# Patient Record
Sex: Female | Born: 1990 | Race: White | Hispanic: Refuse to answer
Health system: Southern US, Community
[De-identification: ages and names within clinical notes are randomized; demographics above are authoritative.]

## PROBLEM LIST (undated history)

## (undated) HISTORY — PX: TUBAL LIGATION: SHX77

---
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
## 2009-01-17 ENCOUNTER — Emergency Department: Payer: Self-pay | Admitting: Emergency Medicine

## 2009-04-21 ENCOUNTER — Emergency Department: Payer: Self-pay | Admitting: Emergency Medicine

## 2009-07-16 ENCOUNTER — Emergency Department: Payer: Self-pay | Admitting: Emergency Medicine

## 2010-03-27 DIAGNOSIS — F17200 Nicotine dependence, unspecified, uncomplicated: Secondary | ICD-10-CM | POA: Insufficient documentation

## 2010-03-27 DIAGNOSIS — N946 Dysmenorrhea, unspecified: Secondary | ICD-10-CM | POA: Insufficient documentation

## 2010-05-08 ENCOUNTER — Emergency Department: Payer: Self-pay | Admitting: Emergency Medicine

## 2013-10-06 ENCOUNTER — Emergency Department: Payer: Self-pay | Admitting: Emergency Medicine

## 2014-03-16 ENCOUNTER — Emergency Department: Payer: Self-pay | Admitting: Emergency Medicine

## 2014-03-16 LAB — URINALYSIS, COMPLETE
Bilirubin,UR: NEGATIVE
Blood: NEGATIVE
Glucose,UR: NEGATIVE mg/dL (ref 0–75)
Ketone: NEGATIVE
Nitrite: NEGATIVE
Ph: 6 (ref 4.5–8.0)
Protein: NEGATIVE
RBC,UR: 2 /HPF (ref 0–5)
Specific Gravity: 1.016 (ref 1.003–1.030)
Squamous Epithelial: 3
WBC UR: 6 /HPF (ref 0–5)

## 2014-03-19 LAB — BETA STREP CULTURE(ARMC)

## 2014-03-24 ENCOUNTER — Emergency Department: Payer: Self-pay | Admitting: Emergency Medicine

## 2014-03-24 LAB — URINALYSIS, COMPLETE
BLOOD: NEGATIVE
Bilirubin,UR: NEGATIVE
Glucose,UR: NEGATIVE mg/dL (ref 0–75)
Ketone: NEGATIVE
NITRITE: POSITIVE
Ph: 7 (ref 4.5–8.0)
Protein: NEGATIVE
Specific Gravity: 1.017 (ref 1.003–1.030)
Squamous Epithelial: 1
WBC UR: 2 /HPF (ref 0–5)

## 2014-03-26 LAB — URINE CULTURE

## 2014-03-27 LAB — BETA STREP CULTURE(ARMC)

## 2014-05-03 DIAGNOSIS — O0993 Supervision of high risk pregnancy, unspecified, third trimester: Secondary | ICD-10-CM | POA: Insufficient documentation

## 2014-05-06 DIAGNOSIS — F1290 Cannabis use, unspecified, uncomplicated: Secondary | ICD-10-CM | POA: Insufficient documentation

## 2014-05-06 DIAGNOSIS — B3731 Acute candidiasis of vulva and vagina: Secondary | ICD-10-CM | POA: Insufficient documentation

## 2014-05-06 DIAGNOSIS — O09291 Supervision of pregnancy with other poor reproductive or obstetric history, first trimester: Secondary | ICD-10-CM | POA: Insufficient documentation

## 2014-05-06 DIAGNOSIS — F32A Depression, unspecified: Secondary | ICD-10-CM | POA: Insufficient documentation

## 2014-06-14 DIAGNOSIS — O2342 Unspecified infection of urinary tract in pregnancy, second trimester: Secondary | ICD-10-CM | POA: Insufficient documentation

## 2014-06-14 DIAGNOSIS — O3442 Maternal care for other abnormalities of cervix, second trimester: Secondary | ICD-10-CM | POA: Insufficient documentation

## 2014-07-21 ENCOUNTER — Observation Stay
Admit: 2014-07-21 | Disposition: A | Discharge status: Home / Self Care | Payer: Self-pay | Attending: Obstetrics and Gynecology | Admitting: Obstetrics and Gynecology

## 2014-07-21 LAB — URINALYSIS, COMPLETE
BILIRUBIN, UR: NEGATIVE
BLOOD: NEGATIVE
Bacteria: NONE SEEN
Glucose,UR: NEGATIVE mg/dL (ref 0–75)
Ketone: NEGATIVE
Nitrite: NEGATIVE
Ph: 8 (ref 4.5–8.0)
Protein: NEGATIVE
SPECIFIC GRAVITY: 1.01 (ref 1.003–1.030)

## 2014-07-21 LAB — CBC WITH DIFFERENTIAL/PLATELET
BASOS ABS: 0 10*3/uL (ref 0.0–0.1)
Basophil %: 0.2 %
EOS ABS: 0.1 10*3/uL (ref 0.0–0.7)
Eosinophil %: 1.2 %
HCT: 36.4 % (ref 35.0–47.0)
HGB: 11.9 g/dL — AB (ref 12.0–16.0)
LYMPHS ABS: 1.7 10*3/uL (ref 1.0–3.6)
LYMPHS PCT: 19.5 %
MCH: 29 pg (ref 26.0–34.0)
MCHC: 32.8 g/dL (ref 32.0–36.0)
MCV: 88 fL (ref 80–100)
Monocyte #: 0.6 x10 3/mm (ref 0.2–0.9)
Monocyte %: 6.2 %
Neutrophil #: 6.5 10*3/uL (ref 1.4–6.5)
Neutrophil %: 72.9 %
Platelet: 194 10*3/uL (ref 150–440)
RBC: 4.12 10*6/uL (ref 3.80–5.20)
RDW: 14.1 % (ref 11.5–14.5)
WBC: 8.8 10*3/uL (ref 3.6–11.0)

## 2014-07-21 LAB — COMPREHENSIVE METABOLIC PANEL
ANION GAP: 5 — AB (ref 7–16)
Albumin: 3.2 g/dL — ABNORMAL LOW
Alkaline Phosphatase: 77 U/L
BUN: 7 mg/dL
Bilirubin,Total: 0.4 mg/dL
Calcium, Total: 8.1 mg/dL — ABNORMAL LOW
Chloride: 107 mmol/L
Co2: 24 mmol/L
Creatinine: 0.46 mg/dL
EGFR (Non-African Amer.): >60
GLUCOSE: 95 mg/dL
POTASSIUM: 3.6 mmol/L
SGOT(AST): 15 U/L
SGPT (ALT): 9 U/L — ABNORMAL LOW
Sodium: 136 mmol/L
Total Protein: 6.3 g/dL — ABNORMAL LOW

## 2014-07-21 LAB — DRUG SCREEN, URINE
Amphetamines, Ur Screen: NEGATIVE
Barbiturates, Ur Screen: NEGATIVE
Benzodiazepine, Ur Scrn: NEGATIVE
COCAINE METABOLITE, UR ~~LOC~~: NEGATIVE
Cannabinoid 50 Ng, Ur ~~LOC~~: POSITIVE
MDMA (Ecstasy)Ur Screen: NEGATIVE
Methadone, Ur Screen: NEGATIVE
Opiate, Ur Screen: NEGATIVE
PHENCYCLIDINE (PCP) UR S: NEGATIVE
Tricyclic, Ur Screen: NEGATIVE

## 2014-07-21 LAB — LIPASE, BLOOD: Lipase: 24 U/L

## 2014-08-15 NOTE — H&P (Signed)
L&D Evaluation:  History:  HPI 24 yo G3P1102 at [redacted]w[redacted]d by [place] derived [place] of 11/17/2014 presenting with RLQ pain.  Pain is positional, worse with movement an standing, radiates into groin.  No nausea, emesis, fever, chills, dysuria, or temporal relationship to food intake. Duration has been 1 month.  Care thus far has been at [facility], patient was offfered 17-P injection secondary to 36 week delivery.  No LOF, no VB, no ctx, +FM.   Presents with abdominal pain   Patient's Medical History No Chronic Illness    Patient's Surgical History none    Medications Pre Natal Vitamins    Allergies NKDA   Social History none    Family History Non-Contributory    ROS:  ROS negative unless otherwise noted in HPI   Exam:  Vital Signs stable    General no apparent distress   Abdomen gravid, non-tender   Estimated Fetal Weight Average for gestational age   Back no CVAT   Edema no edema    FHT FHT +140   Other Normal CBC, CMP, UA, and lipase   Impression:  Impression [redacted]w[redacted]d with round ligament pain   Plan:  Comments Patient reassured, based on history classic round ligament pain.  Exam and labs not concerning for appendicitis, cholecystitis or cholelithiasis, pylo.  Has follow up on 4/18 [facility]   Electronic Signatures: Dorthula Nettles (MD)  (Signed 15-Apr-16 10:44)  Authored: L&D Evaluation   Last Updated: 15-Apr-16 10:44 by Dorthula Nettles (MD)

## 2014-10-04 DIAGNOSIS — O26849 Uterine size-date discrepancy, unspecified trimester: Secondary | ICD-10-CM | POA: Insufficient documentation

## 2014-10-12 DIAGNOSIS — K0889 Other specified disorders of teeth and supporting structures: Secondary | ICD-10-CM | POA: Insufficient documentation

## 2014-10-30 DIAGNOSIS — D649 Anemia, unspecified: Secondary | ICD-10-CM | POA: Insufficient documentation

## 2014-11-27 DIAGNOSIS — F331 Major depressive disorder, recurrent, moderate: Secondary | ICD-10-CM | POA: Insufficient documentation

## 2016-04-05 ENCOUNTER — Emergency Department
Admission: EM | Admit: 2016-04-05 | Discharge: 2016-04-05 | Disposition: A | Discharge status: Home / Self Care | Payer: Medicaid Other | Attending: Emergency Medicine | Admitting: Emergency Medicine

## 2016-04-05 ENCOUNTER — Encounter: Payer: Self-pay | Admitting: Emergency Medicine

## 2016-04-05 ENCOUNTER — Emergency Department: Payer: Medicaid Other

## 2016-04-05 DIAGNOSIS — Z5181 Encounter for therapeutic drug level monitoring: Secondary | ICD-10-CM | POA: Insufficient documentation

## 2016-04-05 DIAGNOSIS — F17200 Nicotine dependence, unspecified, uncomplicated: Secondary | ICD-10-CM | POA: Insufficient documentation

## 2016-04-05 DIAGNOSIS — R45851 Suicidal ideations: Secondary | ICD-10-CM | POA: Diagnosis present

## 2016-04-05 DIAGNOSIS — F329 Major depressive disorder, single episode, unspecified: Secondary | ICD-10-CM | POA: Insufficient documentation

## 2016-04-05 LAB — URINALYSIS, COMPLETE (UACMP) WITH MICROSCOPIC
BACTERIA UA: NONE SEEN
BILIRUBIN URINE: NEGATIVE
Glucose, UA: NEGATIVE mg/dL
HGB URINE DIPSTICK: NEGATIVE
Ketones, ur: NEGATIVE mg/dL
NITRITE: NEGATIVE
PROTEIN: NEGATIVE mg/dL
SPECIFIC GRAVITY, URINE: 1.011 (ref 1.005–1.030)
pH: 7 (ref 5.0–8.0)

## 2016-04-05 LAB — CBC
HEMATOCRIT: 39 % (ref 35.0–47.0)
HEMOGLOBIN: 13.1 g/dL (ref 12.0–16.0)
MCH: 28.5 pg (ref 26.0–34.0)
MCHC: 33.5 g/dL (ref 32.0–36.0)
MCV: 85.1 fL (ref 80.0–100.0)
Platelets: 228 10*3/uL (ref 150–440)
RBC: 4.58 MIL/uL (ref 3.80–5.20)
RDW: 14.2 % (ref 11.5–14.5)
WBC: 24.3 10*3/uL — AB (ref 3.6–11.0)

## 2016-04-05 LAB — COMPREHENSIVE METABOLIC PANEL
ALBUMIN: 4.4 g/dL (ref 3.5–5.0)
ALT: 10 U/L — ABNORMAL LOW (ref 14–54)
ANION GAP: 7 (ref 5–15)
AST: 18 U/L (ref 15–41)
Alkaline Phosphatase: 61 U/L (ref 38–126)
BILIRUBIN TOTAL: 0.5 mg/dL (ref 0.3–1.2)
BUN: 15 mg/dL (ref 6–20)
CO2: 25 mmol/L (ref 22–32)
Calcium: 9 mg/dL (ref 8.9–10.3)
Chloride: 105 mmol/L (ref 101–111)
Creatinine, Ser: 0.83 mg/dL (ref 0.44–1.00)
GFR calc Af Amer: >60 mL/min (ref >60)
GFR calc non Af Amer: >60 mL/min (ref >60)
GLUCOSE: 132 mg/dL — AB (ref 65–99)
Potassium: 4 mmol/L (ref 3.5–5.1)
SODIUM: 137 mmol/L (ref 135–145)
TOTAL PROTEIN: 7.3 g/dL (ref 6.5–8.1)

## 2016-04-05 LAB — URINE DRUG SCREEN, QUALITATIVE (ARMC ONLY)
Amphetamines, Ur Screen: NOT DETECTED
BARBITURATES, UR SCREEN: NOT DETECTED
Benzodiazepine, Ur Scrn: NOT DETECTED
CANNABINOID 50 NG, UR ~~LOC~~: POSITIVE — AB
Cocaine Metabolite,Ur ~~LOC~~: NOT DETECTED
MDMA (Ecstasy)Ur Screen: NOT DETECTED
METHADONE SCREEN, URINE: NOT DETECTED
Opiate, Ur Screen: NOT DETECTED
Phencyclidine (PCP) Ur S: NOT DETECTED
TRICYCLIC, UR SCREEN: NOT DETECTED

## 2016-04-05 LAB — ETHANOL: Alcohol, Ethyl (B): <5 mg/dL (ref <5)

## 2016-04-05 LAB — POCT PREGNANCY, URINE: PREG TEST UR: NEGATIVE

## 2016-04-05 LAB — TROPONIN I

## 2016-04-05 MED ORDER — ACETAMINOPHEN 325 MG PO TABS
650.0000 mg | ORAL_TABLET | Freq: Once | ORAL | Status: AC
  Administered 2016-04-05: 650 mg via ORAL
  Filled 2016-04-05: qty 2

## 2016-04-05 NOTE — ED Notes (Signed)
Patient brought in under IVC for voicing SI thoughts to police officer.

## 2016-04-05 NOTE — ED Notes (Signed)
Dietary called for missing breakfast tray. Patient awake, alert, and oriented. She denies SI. She says that yesterday she was just "stressed out and tired." She has been in treatment for depression but is not taking medications at this time. Maintained on 15 minute checks and observation by security camera for safety.

## 2016-04-05 NOTE — ED Notes (Signed)
Patient received breakfast tray. Patient resting quietly in room. No noted distress or abnormal behaviors noted. Will continue 15 minute checks and observation by security camera for safety.

## 2016-04-05 NOTE — BH Assessment (Signed)
Assessment Note  Andrea Villarreal is an 25 y.o. female  presenting to the ED under IVC after expressing suicidal thoughts to the [facility].  Pt. reports she was arguing with boyfriend tonight and police were called to scene.  Sheriff was taking patient home to mothers house when she told officer she had SI thoughts.  Patient denies SI with no plan while in the ED.  She states she was seeing a psychiatrist about 6 months at [facility] where she was receiving treatment and medication management for anxiety, bipolar, and depression.  She states she stopped going to her appointments because it was difficult for her to go once a week with small children.  She also states that she did not like the side effects of her medications.  Pt denies drug use although her UDS was positive for cannabis.  Disposition pending telepsych evaluation.  Diagnosis: Depression  Past Medical History: History reviewed. No pertinent past medical history.  History reviewed. No pertinent surgical history.  Family History: No family history on file.  Social History:  reports that she has been smoking.  She does not have any smokeless tobacco history on file. She reports that she does not drink alcohol. Her drug history is not on file.  Additional Social History:  Alcohol / Drug Use Pain Medications: See PTA Prescriptions: See PTA Over the Counter: See PTA History of alcohol / drug use?: No history of alcohol / drug abuse (Pt denies hx of drug/alcohol use.)  CIWA: CIWA-Ar BP: (!) 122/56 Pulse Rate: 99 COWS:    Allergies: No Known Allergies  Home Medications:  (Not in a hospital admission)  OB/GYN Status:  Patient's last menstrual period was 03/07/2016.  General Assessment Data Location of Assessment: [facility] ED TTS Assessment: In system Is this a Tele or Face-to-Face Assessment?: Face-to-Face Is this an Initial Assessment or a Re-assessment for this encounter?: Initial Assessment Marital status:  Single Maiden name: na Is patient pregnant?: No Pregnancy Status: No Living Arrangements: Parent, Children Can pt return to current living arrangement?: Yes Admission Status: Involuntary Is patient capable of signing voluntary admission?: Yes Referral Source: Other (police) Insurance type: [place] Screening Exam ([place]) Medical Exam completed: Yes  Crisis Care Plan Living Arrangements: Parent, Children Legal Guardian: Other: (self) Name of Psychiatrist: [facility] Name of Therapist: [facility]  Education Status Is patient currently in school?: No Current Grade: na Highest grade of school patient has completed: na Name of school: na Contact person: na  Risk to self with the past 6 months Suicidal Ideation: Yes-Currently Present Has patient been a risk to self within the past 6 months prior to admission? : No Suicidal Intent: No Has patient had any suicidal intent within the past 6 months prior to admission? : No Is patient at risk for suicide?: No Suicidal Plan?: No Has patient had any suicidal plan within the past 6 months prior to admission? : No Access to Means: No What has been your use of drugs/alcohol within the last 12 months?: cannabis Previous Attempts/Gestures: No How many times?: 0 Other Self Harm Risks: none identified Triggers for Past Attempts: None known Intentional Self Injurious Behavior: None Family Suicide History: No Recent stressful life event(s): Conflict (Comment), Financial Problems (conflict with boyfriend) Persecutory voices/beliefs?: No Depression: Yes Depression Symptoms: Loss of interest in usual pleasures, Fatigue, Feeling worthless/self pity Substance abuse history and/or treatment for substance abuse?: No Suicide prevention information given to non-admitted patients: Not applicable  Risk to Others within  the past 6 months Homicidal Ideation: No Does patient have any lifetime risk of violence toward others beyond the six months  prior to admission? : No Thoughts of Harm to Others: No Current Homicidal Intent: No Current Homicidal Plan: No Access to Homicidal Means: No Identified Victim: none identified History of harm to others?: No Assessment of Violence: None Noted Violent Behavior Description: none identified Does patient have access to weapons?: No Criminal Charges Pending?: No Does patient have a court date: No Is patient on probation?: No  Psychosis Hallucinations: None noted Delusions: None noted  Mental Status Report Appearance/Hygiene: In scrubs Eye Contact: Good Motor Activity: Freedom of movement Speech: Logical/coherent Level of Consciousness: Alert Mood: Depressed Affect: Appropriate to circumstance, Depressed Anxiety Level: Minimal Thought Processes: Coherent, Relevant Judgement: Partial Orientation: Person, Place, Time, Situation Obsessive Compulsive Thoughts/Behaviors: None  Cognitive Functioning Concentration: Normal Memory: Recent Intact, Remote Intact IQ: Average Insight: Fair Impulse Control: Fair Appetite: Poor Weight Loss: 20 Weight Gain: 0 Sleep: Decreased Total Hours of Sleep: 3 Vegetative Symptoms: None  ADLScreening [facility] Assessment Services) Patient's cognitive ability adequate to safely complete daily activities?: Yes Patient able to express need for assistance with ADLs?: Yes Independently performs ADLs?: Yes (appropriate for developmental age)  Prior Inpatient Therapy Prior Inpatient Therapy: No Prior Therapy Dates: na Prior Therapy Facilty/Provider(s): na Reason for Treatment: na  Prior Outpatient Therapy Prior Outpatient Therapy: Yes Prior Therapy Dates: 2016 Prior Therapy Facilty/Provider(s): [facility] Reason for Treatment: depression Does patient have an ACCT team?: No Does patient have Intensive In-House Services?  : No Does patient have [facility] services? : No Does patient have P4CC services?: No  ADL Screening (condition at time of  admission) Patient's cognitive ability adequate to safely complete daily activities?: Yes Patient able to express need for assistance with ADLs?: Yes Independently performs ADLs?: Yes (appropriate for developmental age)       Abuse/Neglect Assessment (Assessment to be complete while patient is alone) Physical Abuse: Denies Verbal Abuse: Denies Sexual Abuse: Denies Exploitation of patient/patient's resources: Denies Self-Neglect: Denies Values / Beliefs Cultural Requests During Hospitalization: None Spiritual Requests During Hospitalization: None Consults Spiritual Care Consult Needed: No Social Work Consult Needed: No      Additional Information 1:1 In Past 12 Months?: No CIRT Risk: No Elopement Risk: No Does patient have medical clearance?: Yes     Disposition:  Disposition Initial Assessment Completed for this Encounter: Yes Disposition of Patient: Other dispositions Other disposition(s): Other (Comment) (Pending [facility] consult)  On Site Evaluation by:   Reviewed with Physician:    Oneita Hurt 04/05/2016 4:28 AM

## 2016-04-05 NOTE — ED Notes (Addendum)
ED BHU PLACEMENT JUSTIFICATION Is the patient under IVC or is there intent for IVC: yes Is the patient medically cleared: yes Is there vacancy in the ED BHU: yes Is the population mix appropriate for patient: yes Is the patient awaiting placement in inpatient or outpatient setting: n/a Has the patient had a psychiatric consult: no Survey of unit performed for contraband, proper placement and condition of furniture, tampering with fixtures in bathroom, shower, and each patient room: yes  Pt ambulatory to room without issue, resp even and unlabored.  Skin warm and dry.  Pts speech clear.     PLAN OF CARE Provide calm/safe environment. Vital signs assessed twice daily. ED BHU Assessment once each 12-hour shift. Collaborate with intake RN daily or as condition indicates. Assure the ED provider has rounded once each shift. Provide and encourage hygiene. Provide redirection as needed. Assess for escalating behavior; address immediately and inform ED provider.  Assess family dynamic and appropriateness for visitation as needed: yes

## 2016-04-05 NOTE — ED Notes (Signed)
Patient tearful after assessment. Patient was offered a shower but declined.Maintained on 15 minute checks and observation by security camera for safety.

## 2016-04-05 NOTE — ED Notes (Signed)
Pt. States she was taking medication for anxiety/bipolar and depression.  Pt. States she stopped taking medication 6 months ago.  Pt. States she was also talking to a psychiatrist, but it became too difficult to keep up with appointments.

## 2016-04-05 NOTE — ED Notes (Signed)
Patient discharged ambulatory to home. She denies SI or HI. Discharge instructions reviewed with patient, she verbalizes understanding. Patient received all personal belongings.

## 2016-04-05 NOTE — ED Notes (Signed)
PT'S KEYS ARE IN HER JACKET!!!!!!!

## 2016-04-05 NOTE — ED Notes (Signed)
BEHAVIORAL HEALTH ROUNDING  Patient sleeping: yes Patient alert and oriented: yes  Behavior appropriate: Yes. ; If no, describe:  Nutrition and fluids offered: Yes  Toileting and hygiene offered: Yes  Sitter present: not applicable, Q 15 min safety rounds and observation via security camera.  Law enforcement present: Yes ODS

## 2016-04-05 NOTE — ED Notes (Addendum)
Pt. Here under IVC commitment for Suicidal idealization expressed to [employer].  Pt. States she was arguing with boyfriend tonight and police were called to scene.  Sheriff was taking patient home to mothers house when she told officer she had SI thoughts.

## 2016-04-05 NOTE — ED Notes (Signed)
Patient told that based upon [facility] assessment, plan would be for discharge. Patient happy about this. She called family to alert them.

## 2016-04-05 NOTE — ED Provider Notes (Signed)
[facility] Emergency Department Provider Note   ____________________________________________   First MD Initiated Contact with Patient 04/05/16 [phone]     (approximate)  I have reviewed the triage vital signs and the nursing notes.   HISTORY  Chief Complaint Suicidal    HPI Andrea Villarreal is a 25 y.o. female who comes into the hospital today with suicidal thoughts. The patient reports that she was brought in by the [facility] cousin these thoughts. She reports that she's had them for a long time. Normally if she feels suicidal she'll think about her kids are hugs them and she feels better. She denies any suicide attempts in the past. The patient reports that she thinks about it sometimes going bad things happen. The patient has had a problem with depression and anxiety. The patient reports that although she's had thoughts of suicide in the past she has never had a plan. The patient denies any drinking and smokes marijuana on Christmas. The patient has had a cough. The patient used to take some depression medication but reports that she has not about 5-6 months. She used to be on Lexapro and sertraline and some other medication she does not remember. She is here today for evaluation.   History reviewed. No pertinent past medical history.  There are no active problems to display for this patient.   History reviewed. No pertinent surgical history.  Prior to Admission medications   Not on File    Allergies Patient has no known allergies.  No family history on file.  Social History Social History  Substance Use Topics  . Smoking status: Current Every Day Smoker  . Smokeless tobacco: Not on file  . Alcohol use No    Review of Systems Constitutional: No fever/chills Eyes: No visual changes. ENT: No sore throat. Cardiovascular:  chest pain. Respiratory: Denies shortness of breath. Gastrointestinal: No abdominal pain.  No nausea, no vomiting.   No diarrhea.  No constipation. Genitourinary: Negative for dysuria. Musculoskeletal: Negative for back pain. Skin: Negative for rash. Neurological: Negative for headaches, focal weakness or numbness. Psychiatric:Suicidal ideation 10-point ROS otherwise negative.  ____________________________________________   PHYSICAL EXAM:  VITAL SIGNS: ED Triage Vitals  Enc Vitals Group     BP 04/05/16 0101 (!) 122/56     Pulse Rate 04/05/16 0101 99     Resp 04/05/16 0101 20     Temp 04/05/16 0101 98.5 F (36.9 C)     Temp Source 04/05/16 0101 Oral     SpO2 04/05/16 0101 99 %     Weight 04/05/16 0102 115 lb (52.2 kg)     Height 04/05/16 0102 5\' 1"  (1.549 m)     Head Circumference --      Peak Flow --      Pain Score --      Pain Loc --      Pain Edu? --      Excl. in [place]? --     Constitutional: Alert and oriented. Well appearing and in no acute distress. Eyes: Conjunctivae are normal. PERRL. EOMI. Head: Atraumatic. Nose: No congestion/rhinnorhea. Mouth/Throat: Mucous membranes are moist.  Oropharynx non-erythematous. Cardiovascular: Normal rate, regular rhythm. Grossly normal heart sounds.  Good peripheral circulation. Respiratory: Normal respiratory effort.  No retractions. Lungs CTAB. Gastrointestinal: Soft and nontender. No distention. positive Musculoskeletal: No lower extremity tenderness nor edema.   Neurologic:  Normal speech and language.  Skin:  Skin is warm, dry and intact.  Psychiatric: Mood and affect are normal.  ____________________________________________   LABS (all labs ordered are listed, but only abnormal results are displayed)  Labs Reviewed  CBC - Abnormal; Notable for the following:       Result Value   WBC 24.3 (*)    All other components within normal limits  COMPREHENSIVE METABOLIC PANEL - Abnormal; Notable for the following:    Glucose, Bld 132 (*)    ALT 10 (*)    All other components within normal limits  URINALYSIS, COMPLETE (UACMP) WITH  MICROSCOPIC - Abnormal; Notable for the following:    Color, Urine YELLOW (*)    APPearance CLEAR (*)    Leukocytes, UA MODERATE (*)    Squamous Epithelial / LPF 0-5 (*)    All other components within normal limits  URINE DRUG SCREEN, QUALITATIVE ([facility] ONLY) - Abnormal; Notable for the following:    Cannabinoid 50 Ng, Ur [place] POSITIVE (*)    All other components within normal limits  ETHANOL  TROPONIN I  POC URINE PREG, ED  POCT PREGNANCY, URINE   ____________________________________________  EKG  ED ECG REPORT I, Loney Hering, the attending physician, personally viewed and interpreted this ECG.   Date: 04/05/2016  EKG Time: 314  Rate: 82  Rhythm: normal sinus rhythm  Axis: normal  Intervals:none  ST&T Change: none  ____________________________________________  RADIOLOGY  CXR ____________________________________________   PROCEDURES  Procedure(s) performed: None  Procedures  Critical Care performed: No  ____________________________________________   INITIAL IMPRESSION / ASSESSMENT AND PLAN / ED COURSE  Pertinent labs & imaging results that were available during my care of the patient were reviewed by me and considered in my medical decision making (see chart for details).  This is a 25 year old female who comes into the hospital today with suicidal ideation.  Clinical Course as of Apr 05 816  Sat Apr 05, 2016  0410 No acute pulmonary process. DG Chest 2 View [AW]    Clinical Course User Index [AW] Loney Hering, MD     ____________________________________________   FINAL CLINICAL IMPRESSION(S) / ED DIAGNOSES  Final diagnoses:  Suicidal ideation      NEW MEDICATIONS STARTED DURING THIS VISIT:  New Prescriptions   No medications on file     Note:  This document was prepared using Dragon voice recognition software and may include unintentional dictation errors.    Loney Hering, MD 04/05/16 [phone]

## 2016-04-05 NOTE — ED Notes (Signed)
Patient assigned to appropriate care area. Patient oriented to unit/care area: Informed that, for their safety, care areas are designed for safety and monitored by security cameras at all times; and visiting hours explained to patient. Patient verbalizes understanding, and verbal contract for safety obtained. 

## 2016-04-05 NOTE — ED Notes (Signed)
Patient resting quietly in room. No noted distress or abnormal behaviors noted. Will continue 15 minute checks and observation by security camera for safety. 

## 2016-04-05 NOTE — ED Notes (Signed)
Patient having SOC assessment.

## 2016-04-05 NOTE — ED Notes (Signed)

## 2016-04-05 NOTE — ED Provider Notes (Signed)
The patient has been evaluated at by psychiatry.  Patient is clinically stable.  Not felt to be a danger to self or others.  No SI or Hi.  No indication for inpatient psychiatric admission at this time.  IVC discontinued by telepsych. Appropriate for continued outpatient therapy.     Merlyn Lot, MD 04/05/16 [phone]

## 2016-04-05 NOTE — ED Triage Notes (Signed)
Pt brought by [employer] under IVC for suicidal thoughts.

## 2016-12-08 ENCOUNTER — Encounter (HOSPITAL_COMMUNITY): Payer: Self-pay

## 2016-12-08 ENCOUNTER — Emergency Department (HOSPITAL_COMMUNITY)
Admission: EM | Admit: 2016-12-08 | Discharge: 2016-12-08 | Disposition: A | Discharge status: Home / Self Care | Payer: Medicaid - Out of State | Attending: Emergency Medicine | Admitting: Emergency Medicine

## 2016-12-08 DIAGNOSIS — F17200 Nicotine dependence, unspecified, uncomplicated: Secondary | ICD-10-CM | POA: Insufficient documentation

## 2016-12-08 DIAGNOSIS — K047 Periapical abscess without sinus: Secondary | ICD-10-CM | POA: Diagnosis not present

## 2016-12-08 DIAGNOSIS — K0889 Other specified disorders of teeth and supporting structures: Secondary | ICD-10-CM | POA: Diagnosis present

## 2016-12-08 MED ORDER — TRAMADOL HCL 50 MG PO TABS: 50.0000 mg | ORAL_TABLET | Freq: Four times a day (QID) | ORAL | 0 refills | Status: DC | PRN

## 2016-12-08 MED ORDER — CLINDAMYCIN HCL 150 MG PO CAPS
300.0000 mg | ORAL_CAPSULE | Freq: Once | ORAL | Status: AC
  Administered 2016-12-08: 300 mg via ORAL
  Filled 2016-12-08: qty 2

## 2016-12-08 MED ORDER — TRAMADOL HCL 50 MG PO TABS
50.0000 mg | ORAL_TABLET | Freq: Once | ORAL | Status: AC
  Administered 2016-12-08: 50 mg via ORAL
  Filled 2016-12-08: qty 1

## 2016-12-08 MED ORDER — CLINDAMYCIN HCL 150 MG PO CAPS: 450.0000 mg | ORAL_CAPSULE | Freq: Three times a day (TID) | ORAL | 0 refills | Status: DC

## 2016-12-08 NOTE — Discharge Instructions (Signed)
Complete your entire course of antibiotics as prescribed.  You  may use the tramadol for pain relief but do not drive within 4 hours of taking as this will make you drowsy.  Avoid applying heat or ice to this abscess area which can worsen your symptoms.  You may use warm salt water swish and spit treatment or half peroxide and water swish and spit after meals to keep this area clean as discussed.  Clindamycin can have a side effect of diarrhea; I suggest eating yogurt daily while on this medicine which can help prevent this side effect.

## 2016-12-08 NOTE — ED Triage Notes (Signed)
Pt reports that she started on amoxicillin 500 mg TID x 2 weeks. Last dose was Friday, now left jaw is swollen and painful Pt has 2 broken and decayed teeth

## 2016-12-08 NOTE — ED Provider Notes (Signed)
[place] DEPT Provider Note   CSN: [id number] Arrival date & time: 12/08/16  0946     History   Chief Complaint Chief Complaint  Patient presents with  . Dental Pain    HPI Andrea Villarreal is a 26 y.o. female presenting with dental abscess.  She was seen at [facility] on 8/19 at which time she was placed on a 10 day course of amoxil which she finished 3 days ago.  She is supposed to see her dentist once the swelling is improved however it swelled back up since the abx was finished over the past 3 days.  She denies fevers, chills, nausea or vomiting.  She has 2 chronically decayed teeth as the source of the infection. She has no complaint of difficulty swallowing, although chewing makes pain worse.  The patient is taking ibuprofen 800 mg without relief of symptoms.    .  The history is provided by the patient and the spouse.    History reviewed. No pertinent past medical history.  There are no active problems to display for this patient.   Past Surgical History:  Procedure Laterality Date  . TUBAL LIGATION      OB History    No data available       Home Medications    Prior to Admission medications   Medication Sig Start Date End Date Taking? Authorizing Provider  clindamycin (CLEOCIN) 150 MG capsule Take 3 capsules (450 mg total) by mouth 3 (three) times daily. 12/08/16   Evalee Jefferson, PA-C  traMADol (ULTRAM) 50 MG tablet Take 1 tablet (50 mg total) by mouth every 6 (six) hours as needed. 12/08/16   Evalee Jefferson, PA-C    Family History No family history on file.  Social History Social History  Substance Use Topics  . Smoking status: Current Every Day Smoker    Packs/day: 0.50  . Smokeless tobacco: Not on file  . Alcohol use No     Allergies   Patient has no known allergies.   Review of Systems Review of Systems  Constitutional: Negative for fever.  HENT: Positive for dental problem. Negative for facial swelling and sore throat.     Respiratory: Negative for shortness of breath.   Musculoskeletal: Negative for neck pain and neck stiffness.     Physical Exam Updated Vital Signs BP (!) 123/58 (BP Location: Right Arm)   Pulse 78   Temp 98.7 F (37.1 C) (Oral)   Resp 16   Ht 5' (1.524 m)   Wt 49.9 kg (110 lb)   LMP 12/07/2016   SpO2 100%   BMI 21.48 kg/m   Physical Exam  Constitutional: She is oriented to person, place, and time. She appears well-developed and well-nourished. No distress.  HENT:  Head: Normocephalic and atraumatic.  Right Ear: Tympanic membrane and external ear normal.  Left Ear: Tympanic membrane and external ear normal.  Mouth/Throat: Oropharynx is clear and moist and mucous membranes are normal. No oral lesions. No trismus in the jaw. Dental abscesses present.    Eyes: Conjunctivae are normal.  Neck: Normal range of motion. Neck supple.  Cardiovascular: Normal rate and normal heart sounds.   Pulmonary/Chest: Effort normal.  Abdominal: She exhibits no distension.  Musculoskeletal: Normal range of motion.  Lymphadenopathy:       Head (left side): Submandibular adenopathy present.    She has no cervical adenopathy.  Neurological: She is alert and oriented to person, place, and time.  Skin: Skin is warm and dry. No  erythema.  No facial erythema.  Psychiatric: She has a normal mood and affect.     ED Treatments / Results  Labs (all labs ordered are listed, but only abnormal results are displayed) Labs Reviewed - No data to display  EKG  EKG Interpretation None       Radiology No results found.  Procedures Procedures (including critical care time)  Medications Ordered in ED Medications  traMADol (ULTRAM) tablet 50 mg (not administered)  clindamycin (CLEOCIN) capsule 300 mg (not administered)     Initial Impression / Assessment and Plan / ED Course  I have reviewed the triage vital signs and the nursing notes.  Pertinent labs & imaging results that were available  during my care of the patient were reviewed by me and considered in my medical decision making (see chart for details).     Clindamycin, tramadol, dental f/u .  No trismus, no mouth or posterior pharyngeal edema that would cause respiratory compromise.  Return precautions discussed.  Pt will plan appt with dentistry at the end of this course of abx.  Final Clinical Impressions(s) / ED Diagnoses   Final diagnoses:  Dental abscess    New Prescriptions New Prescriptions   CLINDAMYCIN (CLEOCIN) 150 MG CAPSULE    Take 3 capsules (450 mg total) by mouth 3 (three) times daily.   TRAMADOL (ULTRAM) 50 MG TABLET    Take 1 tablet (50 mg total) by mouth every 6 (six) hours as needed.     Evalee Jefferson, PA-C 12/08/16 1021    Evalee Jefferson, PA-C 12/08/16 1022    Julianne Rice, MD 12/08/16 [phone]

## 2017-02-13 ENCOUNTER — Other Ambulatory Visit: Payer: Self-pay

## 2017-02-13 ENCOUNTER — Emergency Department (HOSPITAL_COMMUNITY)
Admission: EM | Admit: 2017-02-13 | Discharge: 2017-02-13 | Disposition: A | Discharge status: Home / Self Care | Payer: Medicaid - Out of State | Attending: Emergency Medicine | Admitting: Emergency Medicine

## 2017-02-13 ENCOUNTER — Encounter (HOSPITAL_COMMUNITY): Payer: Self-pay

## 2017-02-13 DIAGNOSIS — F17200 Nicotine dependence, unspecified, uncomplicated: Secondary | ICD-10-CM | POA: Insufficient documentation

## 2017-02-13 DIAGNOSIS — B349 Viral infection, unspecified: Secondary | ICD-10-CM | POA: Diagnosis not present

## 2017-02-13 DIAGNOSIS — B09 Unspecified viral infection characterized by skin and mucous membrane lesions: Secondary | ICD-10-CM

## 2017-02-13 DIAGNOSIS — R21 Rash and other nonspecific skin eruption: Secondary | ICD-10-CM | POA: Insufficient documentation

## 2017-02-13 DIAGNOSIS — R0982 Postnasal drip: Secondary | ICD-10-CM | POA: Diagnosis not present

## 2017-02-13 DIAGNOSIS — R070 Pain in throat: Secondary | ICD-10-CM | POA: Diagnosis present

## 2017-02-13 LAB — URINALYSIS, ROUTINE W REFLEX MICROSCOPIC
BILIRUBIN URINE: NEGATIVE
Glucose, UA: NEGATIVE mg/dL
HGB URINE DIPSTICK: NEGATIVE
KETONES UR: NEGATIVE mg/dL
Leukocytes, UA: NEGATIVE
NITRITE: NEGATIVE
Protein, ur: NEGATIVE mg/dL
SPECIFIC GRAVITY, URINE: 1.015 (ref 1.005–1.030)
pH: 7 (ref 5.0–8.0)

## 2017-02-13 LAB — CBC WITH DIFFERENTIAL/PLATELET
Basophils Absolute: 0 10*3/uL (ref 0.0–0.1)
Basophils Relative: 1 %
EOS ABS: 0.1 10*3/uL (ref 0.0–0.7)
Eosinophils Relative: 2 %
HCT: 41 % (ref 36.0–46.0)
Hemoglobin: 13.5 g/dL (ref 12.0–15.0)
LYMPHS ABS: 2.6 10*3/uL (ref 0.7–4.0)
LYMPHS PCT: 42 %
MCH: 28.7 pg (ref 26.0–34.0)
MCHC: 32.9 g/dL (ref 30.0–36.0)
MCV: 87.2 fL (ref 78.0–100.0)
MONO ABS: 0.3 10*3/uL (ref 0.1–1.0)
Monocytes Relative: 5 %
Neutro Abs: 3.1 10*3/uL (ref 1.7–7.7)
Neutrophils Relative %: 50 %
Platelets: 218 10*3/uL (ref 150–400)
RBC: 4.7 MIL/uL (ref 3.87–5.11)
RDW: 14.5 % (ref 11.5–15.5)
WBC: 6.1 10*3/uL (ref 4.0–10.5)

## 2017-02-13 LAB — BASIC METABOLIC PANEL
Anion gap: 5 (ref 5–15)
BUN: 7 mg/dL (ref 6–20)
CALCIUM: 9 mg/dL (ref 8.9–10.3)
CO2: 28 mmol/L (ref 22–32)
CREATININE: 0.65 mg/dL (ref 0.44–1.00)
Chloride: 102 mmol/L (ref 101–111)
GFR calc Af Amer: >60 mL/min (ref >60)
GLUCOSE: 95 mg/dL (ref 65–99)
Potassium: 4.3 mmol/L (ref 3.5–5.1)
SODIUM: 135 mmol/L (ref 135–145)

## 2017-02-13 LAB — POC URINE PREG, ED: PREG TEST UR: NEGATIVE

## 2017-02-13 LAB — MONONUCLEOSIS SCREEN: MONO SCREEN: NEGATIVE

## 2017-02-13 LAB — RAPID STREP SCREEN (MED CTR MEBANE ONLY): Streptococcus, Group A Screen (Direct): NEGATIVE

## 2017-02-13 MED ORDER — LIDOCAINE VISCOUS 2 % MT SOLN: 20.0000 mL | OROMUCOSAL | 1 refills | Status: DC | PRN

## 2017-02-13 MED ORDER — IBUPROFEN 400 MG PO TABS: 400.0000 mg | ORAL_TABLET | Freq: Four times a day (QID) | ORAL | 0 refills | Status: DC | PRN

## 2017-02-13 NOTE — ED Provider Notes (Signed)
[facility] EMERGENCY DEPARTMENT Provider Note   CSN: [id number] Arrival date & time: 02/13/17  8144     History   Chief Complaint Chief Complaint  Patient presents with  . Sore Throat  . Rash    HPI Andrea Villarreal is a 26 y.o. female.  Patient is a 26 year old female who presents to the emergency department and with a complaint of sore throat and rash.  The patient states that over the last 2-3 days she has been increasing problems with sore throat.  It hurts when she swallows.  She also notices a rash on multiple areas of her body.  It started mostly on her extremities and now is on her abdomen and some on her lower extremities.  The patient denies any high fever.  She is not been having diarrhea, she has noted constipation.  The patient denies any hemoptysis.  It is of note that she has 4 children at home some of them school age.  She states they have been sick recently.  She has not been out of the country recently.  She has not had any recent changes in her medications or diet.      History reviewed. No pertinent past medical history.  There are no active problems to display for this patient.   Past Surgical History:  Procedure Laterality Date  . TUBAL LIGATION      OB History    No data available       Home Medications    Prior to Admission medications   Medication Sig Start Date End Date Taking? Authorizing Provider  clindamycin (CLEOCIN) 150 MG capsule Take 3 capsules (450 mg total) by mouth 3 (three) times daily. Patient not taking: Reported on 02/13/2017 12/08/16   Evalee Jefferson, PA-C  traMADol (ULTRAM) 50 MG tablet Take 1 tablet (50 mg total) by mouth every 6 (six) hours as needed. Patient not taking: Reported on 02/13/2017 12/08/16   Evalee Jefferson, PA-C    Family History No family history on file.  Social History Social History   Tobacco Use  . Smoking status: Current Every Day Smoker    Packs/day: 0.50  . Smokeless tobacco: Never Used    Substance Use Topics  . Alcohol use: No  . Drug use: No     Allergies   Patient has no known allergies.   Review of Systems Review of Systems  Constitutional: Positive for fatigue. Negative for activity change.       All ROS Neg except as noted in HPI  HENT: Positive for congestion, postnasal drip and sore throat. Negative for nosebleeds.   Eyes: Negative for photophobia and discharge.  Respiratory: Positive for cough. Negative for shortness of breath and wheezing.   Cardiovascular: Negative for chest pain and palpitations.  Gastrointestinal: Negative for abdominal pain and blood in stool.  Genitourinary: Negative for dysuria, frequency and hematuria.  Musculoskeletal: Positive for myalgias. Negative for arthralgias, back pain and neck pain.  Skin: Positive for rash.  Neurological: Positive for dizziness. Negative for seizures and speech difficulty.  Psychiatric/Behavioral: Negative for confusion and hallucinations.     Physical Exam Updated Vital Signs BP 133/64 (BP Location: Right Arm)   Pulse 68   Temp 97.7 F (36.5 C) (Oral)   Resp 15   Ht 5' (1.524 m)   Wt 49.9 kg (110 lb)   LMP 02/02/2017   SpO2 100%   BMI 21.48 kg/m   Physical Exam  Constitutional: She is oriented to person, place, and time.  She appears well-developed and well-nourished.  Non-toxic appearance.  HENT:  Head: Normocephalic.  Right Ear: Tympanic membrane and external ear normal.  Left Ear: Tympanic membrane and external ear normal.  There is increased redness of the posterior pharynx.  The uvula is in the midline.  There is mild enlargement of the uvula.  The airway is patent.  Eyes: EOM and lids are normal. Pupils are equal, round, and reactive to light.  Neck: Normal range of motion. Neck supple. Carotid bruit is not present.  Cardiovascular: Normal rate, regular rhythm, normal heart sounds, intact distal pulses and normal pulses.  Pulmonary/Chest: Breath sounds normal. No respiratory  distress.  There is symmetrical rise and fall of the chest.  The patient speaks in complete sentences without any problem whatsoever.  Abdominal: Soft. Bowel sounds are normal. There is no tenderness. There is no guarding.  Musculoskeletal: Normal range of motion.  Lymphadenopathy:       Head (right side): No submandibular adenopathy present.       Head (left side): No submandibular adenopathy present.    She has no cervical adenopathy.  Neurological: She is alert and oriented to person, place, and time. She has normal strength. No cranial nerve deficit or sensory deficit.  Skin: Skin is warm and dry.  Patient has a red raised rash on the arms, on the abdomen, and the lower extremities.  There are no red streaks appreciated.  There is no petechiae noted.  No purpura noted.  Psychiatric: She has a normal mood and affect. Her speech is normal.  Nursing note and vitals reviewed.    ED Treatments / Results  Labs (all labs ordered are listed, but only abnormal results are displayed) Labs Reviewed  RAPID STREP SCREEN (NOT AT [facility])  CULTURE, GROUP A STREP ([place])  MONONUCLEOSIS SCREEN  URINALYSIS, ROUTINE W REFLEX MICROSCOPIC  CBC WITH DIFFERENTIAL/PLATELET  BASIC METABOLIC PANEL  POC URINE PREG, ED    EKG  EKG Interpretation None       Radiology No results found.  Procedures Procedures (including critical care time)  Medications Ordered in ED Medications - No data to display   Initial Impression / Assessment and Plan / ED Course  I have reviewed the triage vital signs and the nursing notes.  Pertinent labs & imaging results that were available during my care of the patient were reviewed by me and considered in my medical decision making (see chart for details).       Final Clinical Impressions(s) / ED Diagnoses MDM Vital signs within normal limits.  Pulse oximetry is 100% on room air.  Patient is in no distress.  She is texting or playing on her phone most of the  emergency department visit.  The patient is concerned that she may have something serious because she is having so many different symptoms.  I attempted to help her to calm down and try to reassure her that her vital signs and oxygen levels were all within normal limits.  Urine pregnancy is negative.  Urinalysis is negative.  Mono screen is negative.  Complete blood count is normal.  Basic metabolic panel is normal.  Anion gap is 5.  And the rapid strep screen is negative.  I reviewed the findings with the patient in terms of which she understands.  Questions were answered.  The patient is ambulatory in the room without any problem whatsoever.  We will use Viscous Xylocaine to assist with her throat pain.  She will also use ibuprofen 400  mg every 6 hours as needed for aching, and/or fever.  I have encouraged the patient to wash hands frequently, and to have the entire family to wash hands frequently.  We discussed the importance of good hydration.  And we also talked about the contagious nature of this problem.  The patient acknowledges understanding of these instructions.  Patient is to return to the emergency department or see her Medicaid access physician if any changes, problems, or concerns.   Final diagnoses:  Viral illness  Viral rash    ED Discharge Orders    None       Lily Kocher, PA-C 02/13/17 1237    Noemi Chapel, MD 02/14/17 1501

## 2017-02-13 NOTE — Discharge Instructions (Signed)
Your urine test, urine pregnancy test, blood test and strep screen are all negative for acute problem.  Your examination suggest viral illness with a viral rash.  Please wash hands frequently.  Have all members of your household wash hands frequently.  Please do not share any eating utensils.  Please do not hug or kiss anyone until this illness is resolved.  Please use ibuprofen every 6 hours.  May gargle with Xylocaine every 3-4 hours as needed for throat discomfort.  Please see your Medicaid access physician or return to the emergency department if any changes, problems, or concerns.

## 2017-02-13 NOTE — ED Triage Notes (Addendum)
Reports of rash all over and sore throat x2 days. Also reports of lower abdominal pain with loss of appetite for months. Denies urinary symptoms, n/v/d,

## 2017-02-15 LAB — CULTURE, GROUP A STREP (THRC)

## 2017-11-22 IMAGING — CR DG CHEST 2V
2 series · 2 of 2 positions shown · non-contrast
Comparison: 05/08/2010

CLINICAL DATA: Cough and left-sided chest pain.

EXAM:
CHEST  2 VIEW

[chest pa]
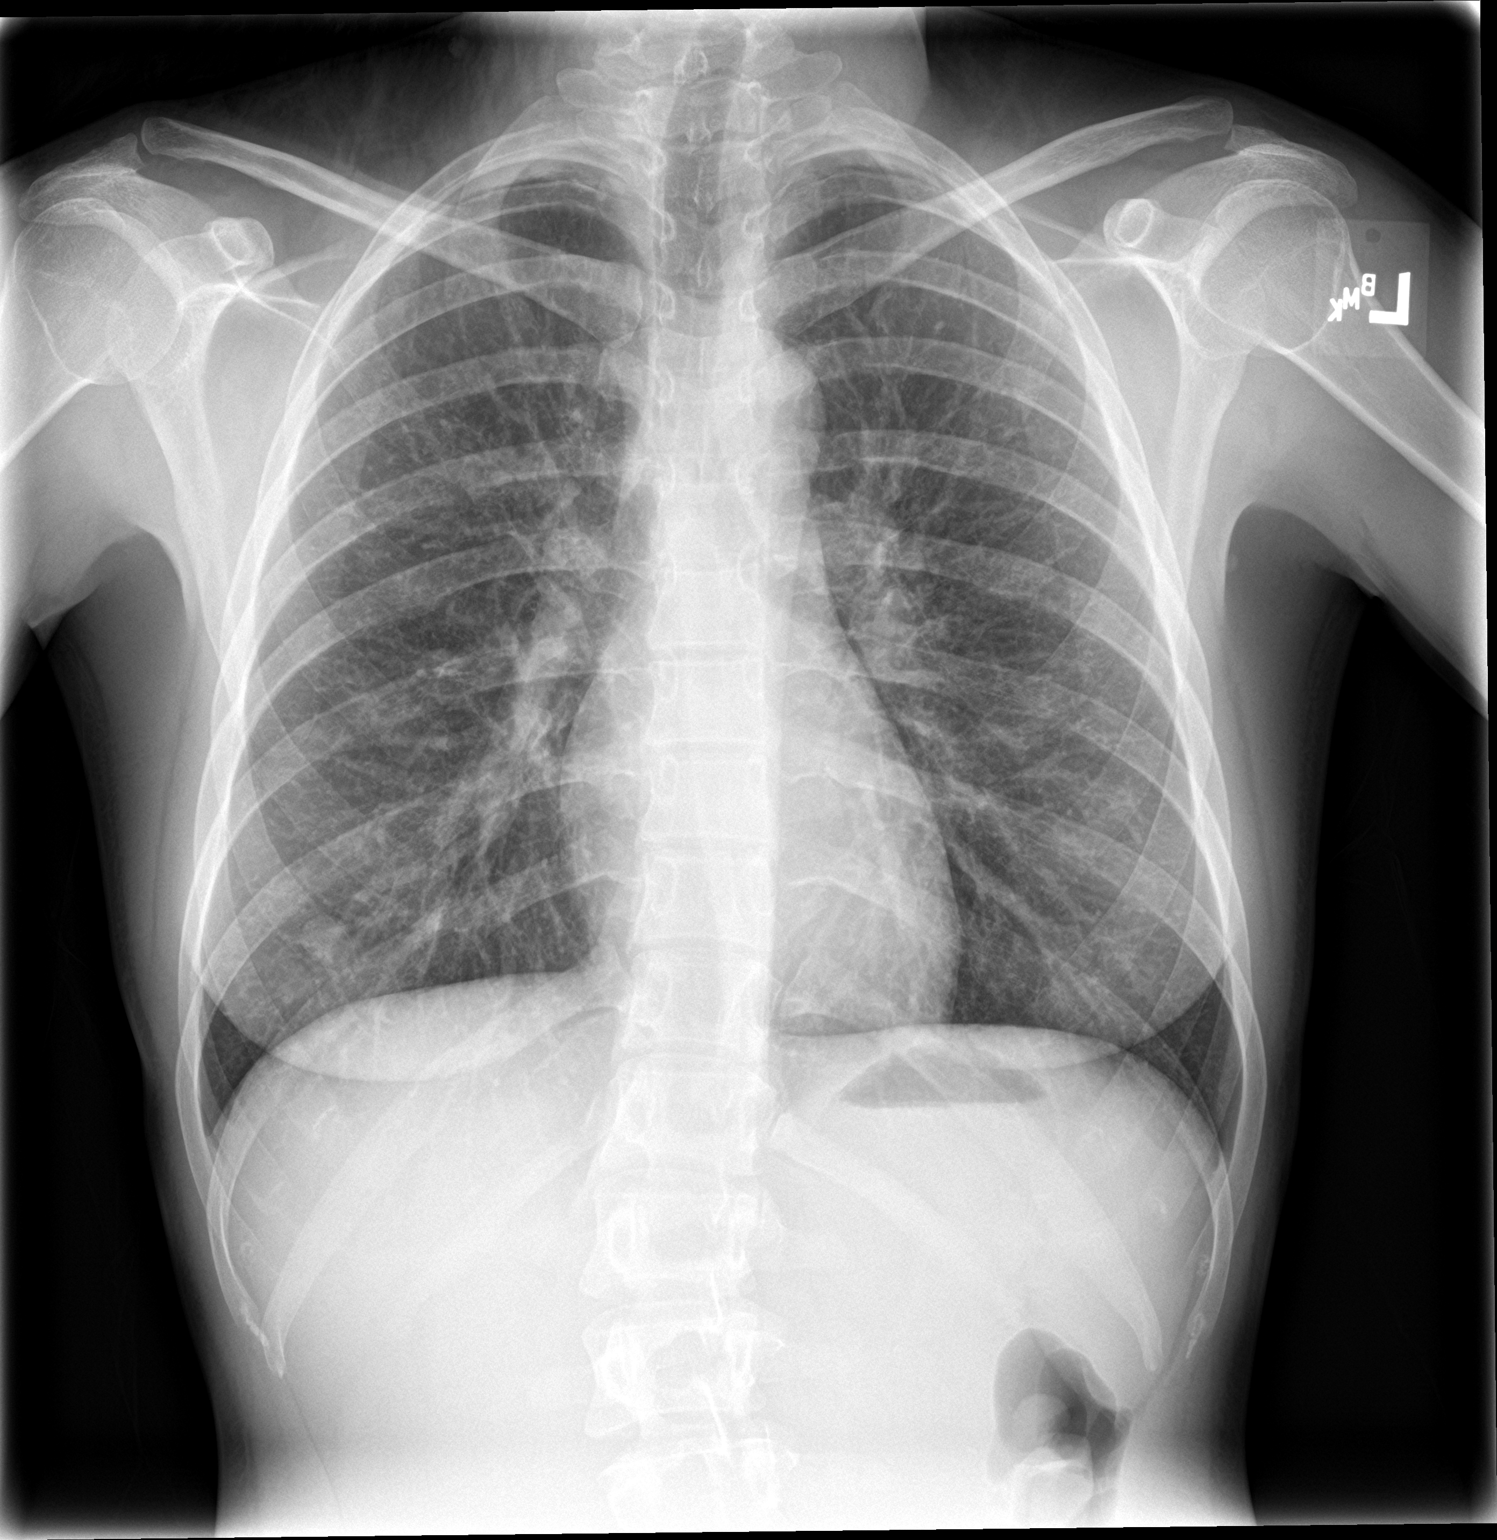

[chest lat]
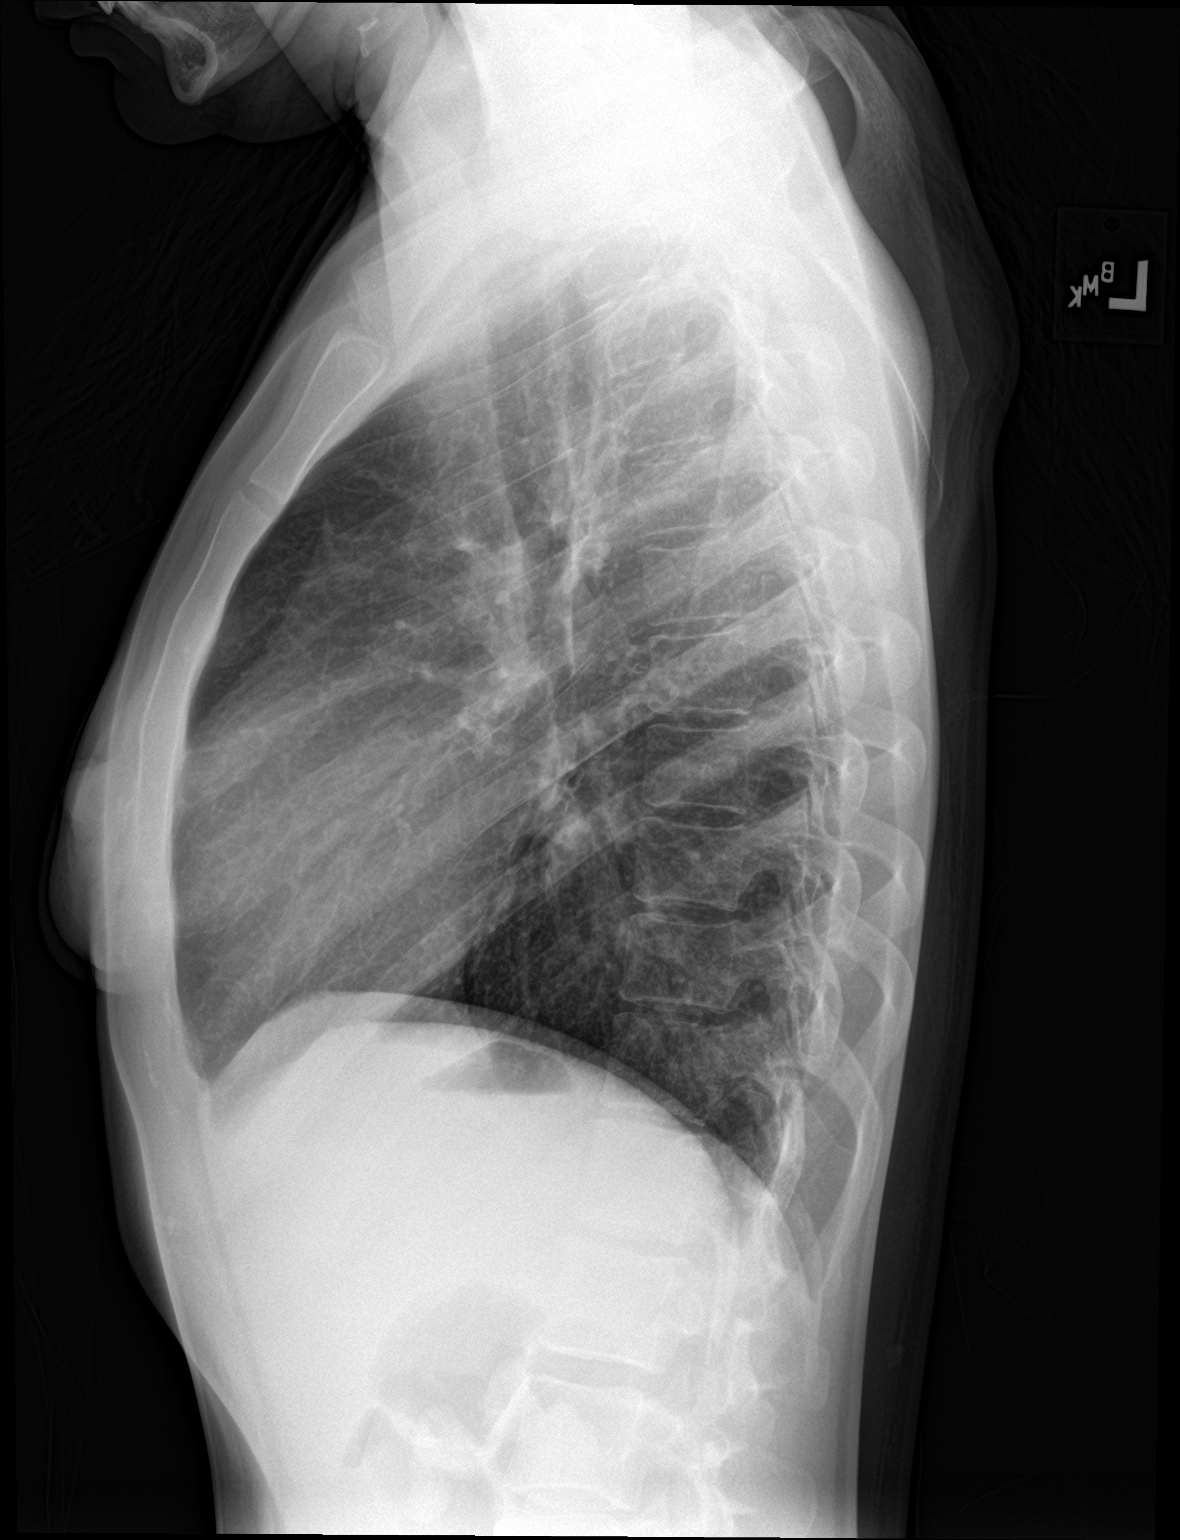

[2 of 2 positions shown; findings below may reference images not displayed]

FINDINGS: The cardiomediastinal contours are normal. The lungs are clear.
Pulmonary vasculature is normal. No consolidation, pleural effusion,
or pneumothorax. No acute osseous abnormalities are seen.
IMPRESSION: No acute pulmonary process.

## 2018-04-01 ENCOUNTER — Emergency Department: Payer: Self-pay

## 2018-04-01 ENCOUNTER — Other Ambulatory Visit: Payer: Self-pay

## 2018-04-01 ENCOUNTER — Encounter: Payer: Self-pay | Admitting: Emergency Medicine

## 2018-04-01 ENCOUNTER — Emergency Department
Admission: EM | Admit: 2018-04-01 | Discharge: 2018-04-01 | Disposition: A | Discharge status: Home / Self Care | Payer: Self-pay | Attending: Emergency Medicine | Admitting: Emergency Medicine

## 2018-04-01 DIAGNOSIS — M779 Enthesopathy, unspecified: Secondary | ICD-10-CM | POA: Insufficient documentation

## 2018-04-01 DIAGNOSIS — M79644 Pain in right finger(s): Secondary | ICD-10-CM | POA: Insufficient documentation

## 2018-04-01 DIAGNOSIS — F17210 Nicotine dependence, cigarettes, uncomplicated: Secondary | ICD-10-CM | POA: Insufficient documentation

## 2018-04-01 DIAGNOSIS — D229 Melanocytic nevi, unspecified: Secondary | ICD-10-CM | POA: Insufficient documentation

## 2018-04-01 MED ORDER — IBUPROFEN 800 MG PO TABS: 800.0000 mg | ORAL_TABLET | Freq: Three times a day (TID) | ORAL | 0 refills | Status: DC | PRN

## 2018-04-01 NOTE — ED Triage Notes (Signed)
Necklace got caught on a mole on side of neck yesterday. Bleeding continues.

## 2018-04-01 NOTE — ED Notes (Signed)
Dry, sterile dressing placed on R mole.

## 2018-04-01 NOTE — ED Triage Notes (Signed)
Also c/o right thumb pain x 1 month.

## 2018-04-01 NOTE — ED Provider Notes (Signed)
[facility] Emergency Department Provider Note  ____________________________________________  Time seen: Approximately 5:53 PM  I have reviewed the triage vital signs and the nursing notes.   HISTORY  Chief Complaint Wound Check    HPI Andrea Villarreal is a 27 y.o. female that presents emergency department for evaluation of mole check and thumb pain.  Patient states that the mole on her neck got caught on her necklace yesterday and began to bleed.  It has not been bleeding today but has been painful.  Patient states that she has had the mole since she was young but it has gotten bigger over the years.  She states that it also used to stand straight up and after he got caught, it is now laying flat.  Patient states that her thumb has been bothering her for the last month.  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  She has had tendinitis in this thumb in the past.  No specific trauma.  History reviewed. No pertinent past medical history.  There are no active problems to display for this patient.   Past Surgical History:  Procedure Laterality Date  . TUBAL LIGATION      Prior to Admission medications   Medication Sig Start Date End Date Taking? Authorizing Provider  clindamycin (CLEOCIN) 150 MG capsule Take 3 capsules (450 mg total) by mouth 3 (three) times daily. Patient not taking: Reported on 02/13/2017 12/08/16   Evalee Jefferson, PA-C  ibuprofen (ADVIL,MOTRIN) 800 MG tablet Take 1 tablet (800 mg total) by mouth every 8 (eight) hours as needed. 04/01/18   Laban Emperor, PA-C  lidocaine (XYLOCAINE) 2 % solution Use as directed 20 mLs every 3 (three) hours as needed in the mouth or throat for mouth pain (gargle and swallow every 3- 4 hours as needed for throat pain.). 02/13/17   Lily Kocher, PA-C  traMADol (ULTRAM) 50 MG tablet Take 1 tablet (50 mg total) by mouth every 6 (six) hours as needed. Patient not taking: Reported on 02/13/2017  12/08/16   Evalee Jefferson, PA-C    Allergies Patient has no known allergies.  No family history on file.  Social History Social History   Tobacco Use  . Smoking status: Current Every Day Smoker    Packs/day: 0.50  . Smokeless tobacco: Never Used  Substance Use Topics  . Alcohol use: No  . Drug use: No     Review of Systems  Constitutional: No fever/chills Cardiovascular: No chest pain. Respiratory: No SOB. Gastrointestinal: No abdominal pain.  No nausea, no vomiting.  Musculoskeletal: Positive for thumb pain.  Skin: Negative for rash, abrasions, lacerations, ecchymosis. Neurological: Negative for headaches   ____________________________________________   PHYSICAL EXAM:  VITAL SIGNS: ED Triage Vitals  Enc Vitals Group     BP 04/01/18 1730 (!) 123/55     Pulse Rate 04/01/18 1730 83     Resp 04/01/18 1730 16     Temp 04/01/18 1730 99.3 F (37.4 C)     Temp Source 04/01/18 1730 Oral     SpO2 04/01/18 1730 (!) 78 %     Weight 04/01/18 1731 115 lb (52.2 kg)     Height 04/01/18 1731 5' (1.524 m)     Head Circumference --      Peak Flow --      Pain Score 04/01/18 1731 0     Pain Loc --      Pain Edu? --  Excl. in [place]? --      Constitutional: Alert and oriented. Well appearing and in no acute distress. Eyes: Conjunctivae are normal. PERRL. EOMI. Head: Atraumatic. ENT:      Ears:      Nose: No congestion/rhinnorhea.      Mouth/Throat: Mucous membranes are moist.  Neck: No stridor.  Cardiovascular: Normal rate, regular rhythm.  Good peripheral circulation.  Symmetric radial pulses bilaterally. Respiratory: Normal respiratory effort without tachypnea or retractions. Lungs CTAB. Good air entry to the bases with no decreased or absent breath sounds. Musculoskeletal: Full range of motion to all extremities. No gross deformities appreciated.  Mild swelling to right thumb.  No erythema.  Full range of motion of the thumb.  Mild tenderness to palpation over dorsal  thumb.  No wounds. Neurologic:  Normal speech and language. No gross focal neurologic deficits are appreciated.  Skin:  Skin is warm, dry.  1 cm x 1 cm dark mole to right neck with dried blood.  Nails bitten to the matrix. Psychiatric: Mood and affect are normal. Speech and behavior are normal. Patient exhibits appropriate insight and judgement.   ____________________________________________   LABS (all labs ordered are listed, but only abnormal results are displayed)  Labs Reviewed - No data to display ____________________________________________  EKG   ____________________________________________  RADIOLOGY Robinette Haines, personally viewed and evaluated these images (plain radiographs) as part of my medical decision making, as well as reviewing the written report by the radiologist.  Dg Hand Complete Right  Result Date: 04/01/2018 CLINICAL DATA:  Right hand pain EXAM: RIGHT HAND - COMPLETE 3+ VIEW COMPARISON:  None. FINDINGS: There is no evidence of fracture or dislocation. There is no evidence of arthropathy or other focal bone abnormality. Soft tissues are unremarkable. IMPRESSION: Negative. Electronically Signed   By: Rolm Baptise M.D.   On: 04/01/2018 18:14    ____________________________________________    PROCEDURES  Procedure(s) performed:    Procedures    Medications - No data to display   ____________________________________________   INITIAL IMPRESSION / ASSESSMENT AND PLAN / ED COURSE  Pertinent labs & imaging results that were available during my care of the patient were reviewed by me and considered in my medical decision making (see chart for details).  Review of the [place] CSRS was performed in accordance of the [place] prior to dispensing any controlled drugs.     Patient presented to the emergency department for evaluation of mole check and thumb pain.  Vital signs and exam are reassuring.  Mole has very slightly torn at the base.  No active  bleeding.  Patient will follow-up with dermatology for removal and for biopsy, as mole has gotten bigger.  Thumb x-ray negative for acute bony abnormalities.  Exam is reassuring and consistent with tendinitis.  Wrist splint was given but patient will check [facility] or [facility] for a splint that better encompasses her thumb.  Patient will be discharged home with prescriptions for ibuprofen. Patient is to follow up with primary care, dermatology, hand as directed. Patient is given ED precautions to return to the ED for any worsening or new symptoms.     ____________________________________________  FINAL CLINICAL IMPRESSION(S) / ED DIAGNOSES  Final diagnoses:  Bleeding skin mole  Tendinitis      NEW MEDICATIONS STARTED DURING THIS VISIT:  ED Discharge Orders         Ordered    ibuprofen (ADVIL,MOTRIN) 800 MG tablet  Every 8 hours PRN,   Status:  Discontinued  04/01/18 1853    ibuprofen (ADVIL,MOTRIN) 800 MG tablet  Every 8 hours PRN     04/01/18 1920              This chart was dictated using voice recognition software/Dragon. Despite best efforts to proofread, errors can occur which can change the meaning. Any change was purely unintentional.    Laban Emperor, PA-C 04/01/18 2243    Arta Silence, MD 04/01/18 [phone]

## 2018-11-25 ENCOUNTER — Other Ambulatory Visit: Payer: Self-pay

## 2018-11-25 ENCOUNTER — Emergency Department
Admission: EM | Admit: 2018-11-25 | Discharge: 2018-11-25 | Disposition: A | Discharge status: Home / Self Care | Payer: Self-pay | Attending: Emergency Medicine | Admitting: Emergency Medicine

## 2018-11-25 ENCOUNTER — Emergency Department: Payer: Self-pay

## 2018-11-25 ENCOUNTER — Encounter: Payer: Self-pay | Admitting: Emergency Medicine

## 2018-11-25 DIAGNOSIS — N390 Urinary tract infection, site not specified: Secondary | ICD-10-CM | POA: Insufficient documentation

## 2018-11-25 DIAGNOSIS — M79604 Pain in right leg: Secondary | ICD-10-CM | POA: Insufficient documentation

## 2018-11-25 DIAGNOSIS — R0789 Other chest pain: Secondary | ICD-10-CM | POA: Insufficient documentation

## 2018-11-25 DIAGNOSIS — F17200 Nicotine dependence, unspecified, uncomplicated: Secondary | ICD-10-CM | POA: Insufficient documentation

## 2018-11-25 LAB — FIBRIN DERIVATIVES D-DIMER (ARMC ONLY): Fibrin derivatives D-dimer (ARMC): 143.51 ng/mL (FEU) (ref 0.00–499.00)

## 2018-11-25 LAB — BASIC METABOLIC PANEL
Anion gap: 9 (ref 5–15)
BUN: 7 mg/dL (ref 6–20)
CO2: 24 mmol/L (ref 22–32)
Calcium: 9.1 mg/dL (ref 8.9–10.3)
Chloride: 104 mmol/L (ref 98–111)
Creatinine, Ser: 0.69 mg/dL (ref 0.44–1.00)
GFR calc Af Amer: >60 mL/min (ref >60)
GFR calc non Af Amer: >60 mL/min (ref >60)
Glucose, Bld: 106 mg/dL — ABNORMAL HIGH (ref 70–99)
Potassium: 3.8 mmol/L (ref 3.5–5.1)
Sodium: 137 mmol/L (ref 135–145)

## 2018-11-25 LAB — URINALYSIS, ROUTINE W REFLEX MICROSCOPIC
Bilirubin Urine: NEGATIVE
Glucose, UA: NEGATIVE mg/dL
Ketones, ur: NEGATIVE mg/dL
Nitrite: NEGATIVE
Protein, ur: NEGATIVE mg/dL
Specific Gravity, Urine: 1.011 (ref 1.005–1.030)
pH: 6 (ref 5.0–8.0)

## 2018-11-25 LAB — CBC
HCT: 41.2 % (ref 36.0–46.0)
Hemoglobin: 13.8 g/dL (ref 12.0–15.0)
MCH: 29.3 pg (ref 26.0–34.0)
MCHC: 33.5 g/dL (ref 30.0–36.0)
MCV: 87.5 fL (ref 80.0–100.0)
Platelets: 212 10*3/uL (ref 150–400)
RBC: 4.71 MIL/uL (ref 3.87–5.11)
RDW: 13.2 % (ref 11.5–15.5)
WBC: 7.4 10*3/uL (ref 4.0–10.5)
nRBC: 0 % (ref 0.0–0.2)

## 2018-11-25 LAB — POCT PREGNANCY, URINE: Preg Test, Ur: NEGATIVE

## 2018-11-25 LAB — TROPONIN I (HIGH SENSITIVITY): Troponin I (High Sensitivity): <2 ng/L (ref <18)

## 2018-11-25 MED ORDER — CEFUROXIME AXETIL 250 MG PO TABS: 250.0000 mg | ORAL_TABLET | Freq: Two times a day (BID) | ORAL | 0 refills | Status: AC

## 2018-11-25 MED ORDER — CEFUROXIME AXETIL 250 MG PO TABS: 250.0000 mg | ORAL_TABLET | Freq: Two times a day (BID) | ORAL | 0 refills | Status: DC

## 2018-11-25 NOTE — ED Provider Notes (Signed)
3:09 PM Assumed care for off going team.   Blood pressure (!) 120/58, pulse 65, temperature 98.1 F (36.7 C), resp. rate 18, height 5\' 2"  (1.575 m), weight 51.7 kg, last menstrual period 11/02/2018, SpO2 100 %.  See their HPI for full report but in brief   Pressure like chest pain, sob. 1 wk of calf pain.  +smoke. D-dimer pending.  If positive then CT if negative okay with d/c.  Markers are negative.  D-dimer was negative.  Pregnancy test was negative.  Ultrasound was negative for DVT.  Chest x-ray was negative.  On reevaluation of patient her symptoms sound related to acid reflux given she feels a burning sensation.  Given negative work-up recommended starting a PPI.  Patient is also endorsing some foul smell to her urine.  Will add on UA.  UA looks consistent with possible UTI.  Will give a course of cefuroxime mean and send a urine culture.  Patient is tolerating p.o. as well.  Patient will benefit from outpatient treatment.  I discussed the provisional nature of ED diagnosis, the treatment so far, the ongoing plan of care, follow up appointments and return precautions with the patient and any family or support people present. They expressed understanding and agreed with the plan, discharged home.         Vanessa [place], MD 11/25/18 [phone]

## 2018-11-25 NOTE — ED Provider Notes (Signed)
[facility] Emergency Department Provider Note   ____________________________________________    I have reviewed the triage vital signs and the nursing notes.   HISTORY  Chief Complaint Anxiety, Chest Pain, and Leg Swelling     HPI Andrea Villarreal is a 28 y.o. female who presents with complaints of chest pain.  Patient reports she was at work today when she suddenly developed severe pressure-like chest pain and difficulty breathing.  She took Tums and Dramamine because she was feeling nauseated but this did not help.  She reports the symptoms lasted about 20 minutes or so and then became more intermittent.  She has never had this before.  Denies fevers or chills.  No cough.  Describes right posterior calf pain and swelling for the last week, notable when pushing the accelerator in her car and when walking.  Not on hormones, has a history of tubal ligation.  No history of blood clots.  No injuries.  Does smoke cigarettes  History reviewed. No pertinent past medical history.  There are no active problems to display for this patient.   Past Surgical History:  Procedure Laterality Date  . TUBAL LIGATION      Prior to Admission medications   Not on File     Allergies Patient has no known allergies.  No family history on file.  Social History Social History   Tobacco Use  . Smoking status: Current Every Day Smoker    Packs/day: 0.50  . Smokeless tobacco: Never Used  Substance Use Topics  . Alcohol use: No  . Drug use: No    Review of Systems  Constitutional: No fever/chills Eyes: No visual changes.  ENT: No sore throat. Cardiovascular: As above Respiratory: As above Gastrointestinal: No abdominal pain.   no vomiting.   Genitourinary: Negative for dysuria. Musculoskeletal: As above Skin: Negative for rash. Neurological: Negative for headaches   ____________________________________________   PHYSICAL EXAM:  VITAL SIGNS:  ED Triage Vitals  Enc Vitals Group     BP 11/25/18 1348 (!) 120/58     Pulse Rate 11/25/18 1353 65     Resp 11/25/18 1353 18     Temp 11/25/18 1353 98.1 F (36.7 C)     Temp src --      SpO2 11/25/18 1353 100 %     Weight 11/25/18 1253 51.7 kg (114 lb)     Height 11/25/18 1253 1.575 m (5\' 2" )     Head Circumference --      Peak Flow --      Pain Score 11/25/18 1253 8     Pain Loc --      Pain Edu? --      Excl. in [place]? --     Constitutional: Alert and oriented.  Eyes: Conjunctivae are normal.   Nose: No congestion/rhinnorhea. Mouth/Throat: Mucous membranes are moist.    Cardiovascular: Normal rate, regular rhythm. Grossly normal heart sounds.  Good peripheral circulation. Respiratory: Normal respiratory effort.  No retractions.  Lungs clear auscultation bilaterally. Gastrointestinal: Soft and nontender. No distention.    Musculoskeletal: Right calf appears mildly swollen compared to left, mild tenderness palpation along the posterior aspect of the right calf warm and well perfused Neurologic:  Normal speech and language. No gross focal neurologic deficits are appreciated.  Skin:  Skin is warm, dry and intact. No rash noted. Psychiatric: Mood and affect are normal. Speech and behavior are normal.  ____________________________________________   LABS (all labs ordered are listed, but only  abnormal results are displayed)  Labs Reviewed  BASIC METABOLIC PANEL - Abnormal; Notable for the following components:      Result Value   Glucose, Bld 106 (*)    All other components within normal limits  CBC  FIBRIN DERIVATIVES D-DIMER ([facility] ONLY)  POC URINE PREG, ED  POCT PREGNANCY, URINE  TROPONIN I (HIGH SENSITIVITY)   ____________________________________________  EKG  ED ECG REPORT I, Lavonia Drafts, the attending physician, personally viewed and interpreted this ECG.  Date: 11/25/2018  Rhythm: normal sinus rhythm QRS Axis: normal Intervals: normal ST/T Wave  abnormalities: normal Narrative Interpretation: no evidence of acute ischemia  ____________________________________________  RADIOLOGY  Chest x-ray unremarkable ____________________________________________   PROCEDURES  Procedure(s) performed: No  Procedures   Critical Care performed: No ____________________________________________   INITIAL IMPRESSION / ASSESSMENT AND PLAN / ED COURSE  Pertinent labs & imaging results that were available during my care of the patient were reviewed by me and considered in my medical decision making (see chart for details).  Patient is at low risk for ACS, however given her description of right calf pain followed by chest pain and mild shortness of breath today possibility of PE although no significant risk factors that I can ascertain.  We will send d-dimer, obtain ultrasound of the right leg and continue to monitor closely.    ____________________________________________   FINAL CLINICAL IMPRESSION(S) / ED DIAGNOSES  Final diagnoses:  Atypical chest pain        Note:  This document was prepared using Dragon voice recognition software and may include unintentional dictation errors.   Lavonia Drafts, MD 11/25/18 [phone]

## 2018-11-25 NOTE — Discharge Instructions (Signed)
We have prescribed an antibiotic to cover you for possible urinary infection.  Your urine culture is pending.  You should follow with your primary care doctor in 2 days if your symptoms are not resolving.  Return to the ER for inability to tolerate eating or any other concerns.

## 2018-11-25 NOTE — ED Triage Notes (Signed)
Pt reports swelling to her right leg from knee down for a couple of days and this am started with CP and shaking.

## 2018-11-27 LAB — URINE CULTURE: Culture: >=100000 — AB

## 2019-01-24 ENCOUNTER — Other Ambulatory Visit: Payer: Self-pay

## 2019-01-24 ENCOUNTER — Ambulatory Visit
Admission: EM | Admit: 2019-01-24 | Discharge: 2019-01-24 | Disposition: A | Discharge status: Home / Self Care | Payer: Medicaid Other | Attending: Family Medicine | Admitting: Family Medicine

## 2019-01-24 DIAGNOSIS — R197 Diarrhea, unspecified: Secondary | ICD-10-CM

## 2019-01-24 DIAGNOSIS — R109 Unspecified abdominal pain: Secondary | ICD-10-CM

## 2019-01-24 DIAGNOSIS — R110 Nausea: Secondary | ICD-10-CM

## 2019-01-24 MED ORDER — DIPHENOXYLATE-ATROPINE 2.5-0.025 MG PO TABS: 1.0000 | ORAL_TABLET | Freq: Four times a day (QID) | ORAL | 0 refills | Status: DC | PRN

## 2019-01-24 NOTE — ED Triage Notes (Signed)
Pt reports central abd pain, diarrhea all weekend. Endorses nausea, no vomiting. No fever.

## 2019-01-24 NOTE — Discharge Instructions (Signed)
Rest. Fluids.  Medication as needed.  If persists, please let us know.  Take care  Dr. Lacinda Axon

## 2019-01-24 NOTE — ED Provider Notes (Signed)
[facility]    CSN: [id number] Arrival date & time: 01/24/19  0950  History   Chief Complaint Chief Complaint  Patient presents with  . Diarrhea  . Abdominal Pain   HPI  28 year old female presents with the above complaints.  Patient reports a 2-day history of lower abdominal pain and diarrhea.  No fever.  She does endorse some nausea.  No vomiting.  She is tolerating food and liquids without difficulty.  Rates her pain as 7/10 in severity.  Localizes the pain to the periumbilical region as well as the lower abdomen.  No reported sick contacts.  No recent dietary changes.  No recent ingestion of undercooked food or meat.  No medications were interventions tried.  Patient states that she has had approximately 7 loose stools a day.  Patient was forced to leave work today due to her illness.  No known exacerbating factors.  No known relieving factors.  No other associated symptoms.  No other complaints.  History reviewed and updated as below.  PMH: No significant PMH.  Past Surgical History:  Procedure Laterality Date  . TUBAL LIGATION      OB History   No obstetric history on file.      Home Medications    Prior to Admission medications   Medication Sig Start Date End Date Taking? Authorizing Provider  diphenoxylate-atropine (LOMOTIL) 2.5-0.025 MG tablet Take 1 tablet by mouth 4 (four) times daily as needed for diarrhea or loose stools. 01/24/19   Coral Spikes, DO   Social History Social History   Tobacco Use  . Smoking status: Current Every Day Smoker    Packs/day: 0.50  . Smokeless tobacco: Never Used  Substance Use Topics  . Alcohol use: No  . Drug use: No     Allergies   Patient has no known allergies.   Review of Systems Review of Systems  Constitutional: Negative for fever.  Respiratory: Negative.   Gastrointestinal: Positive for abdominal pain, diarrhea and nausea.   Physical Exam Triage Vital Signs ED Triage Vitals  Enc Vitals  Group     BP 01/24/19 1006 129/75     Pulse Rate 01/24/19 1006 67     Resp 01/24/19 1006 16     Temp 01/24/19 1006 98.6 F (37 C)     Temp Source 01/24/19 1006 Oral     SpO2 01/24/19 1006 99 %     Weight 01/24/19 1007 107 lb (48.5 kg)     Height 01/24/19 1007 5' (1.524 m)     Head Circumference --      Peak Flow --      Pain Score 01/24/19 1007 7     Pain Loc --      Pain Edu? --      Excl. in [place]? --    Updated Vital Signs BP 129/75 (BP Location: Left Arm)   Pulse 67   Temp 98.6 F (37 C) (Oral)   Resp 16   Ht 5' (1.524 m)   Wt 48.5 kg   LMP 01/23/2019   SpO2 99%   BMI 20.90 kg/m   Visual Acuity Right Eye Distance:   Left Eye Distance:   Bilateral Distance:    Right Eye Near:   Left Eye Near:    Bilateral Near:     Physical Exam Vitals signs and nursing note reviewed.  Constitutional:      General: She is not in acute distress.    Appearance: Normal appearance. She is not  ill-appearing.  HENT:     Head: Normocephalic and atraumatic.  Eyes:     General:        Right eye: No discharge.        Left eye: No discharge.     Conjunctiva/sclera: Conjunctivae normal.  Cardiovascular:     Rate and Rhythm: Normal rate and regular rhythm.     Heart sounds: No murmur.  Pulmonary:     Effort: Pulmonary effort is normal.     Breath sounds: No wheezing, rhonchi or rales.  Abdominal:     General: There is no distension.     Palpations: Abdomen is soft.     Comments: Mild tenderness in the periumbilical region as well as the lower abdomen.  Neurological:     Mental Status: She is alert.  Psychiatric:        Mood and Affect: Mood normal.        Behavior: Behavior normal.    UC Treatments / Results  Labs (all labs ordered are listed, but only abnormal results are displayed) Labs Reviewed - No data to display  EKG   Radiology No results found.  Procedures Procedures (including critical care time)  Medications Ordered in UC Medications - No data to display   Initial Impression / Assessment and Plan / UC Course  I have reviewed the triage vital signs and the nursing notes.  Pertinent labs & imaging results that were available during my care of the patient were reviewed by me and considered in my medical decision making (see chart for details).    28 year old female presents with diarrhea.  Likely viral.  Lomotil as needed.  Supportive care.  Work note given.  Final Clinical Impressions(s) / UC Diagnoses   Final diagnoses:  Diarrhea, unspecified type     Discharge Instructions     Rest. Fluids.  Medication as needed.  If persists, please let us know.  Take care  Dr. Lacinda Axon    ED Prescriptions    Medication Sig Dispense Auth. Provider   diphenoxylate-atropine (LOMOTIL) 2.5-0.025 MG tablet Take 1 tablet by mouth 4 (four) times daily as needed for diarrhea or loose stools. 30 tablet Coral Spikes, DO     PDMP not reviewed this encounter.   Coral Spikes, [place] 01/24/19 1117

## 2019-11-18 IMAGING — DX DG HAND COMPLETE 3+V*R*
3 series · 3 of 3 positions shown · non-contrast
Comparison: None.

CLINICAL DATA: Right hand pain

EXAM:
RIGHT HAND - COMPLETE 3+ VIEW

[hand ap]
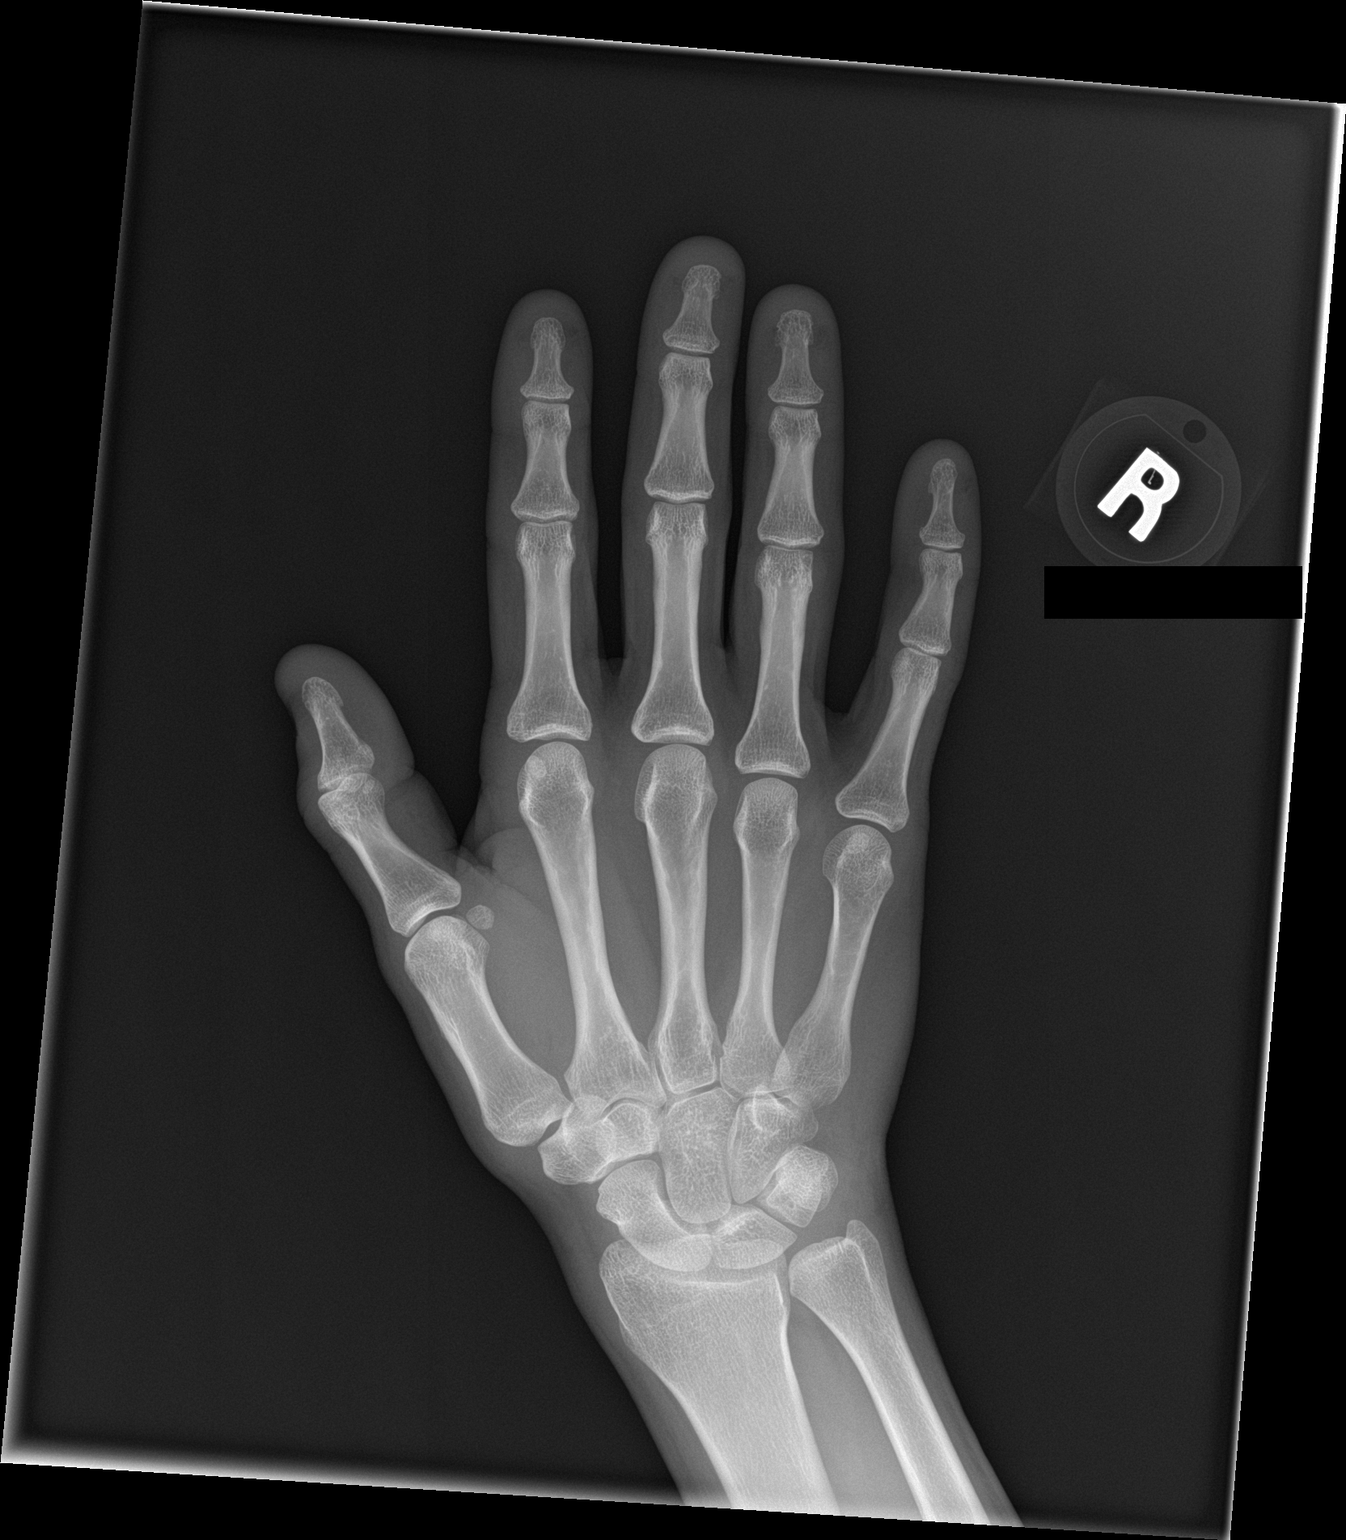

[hand obl]
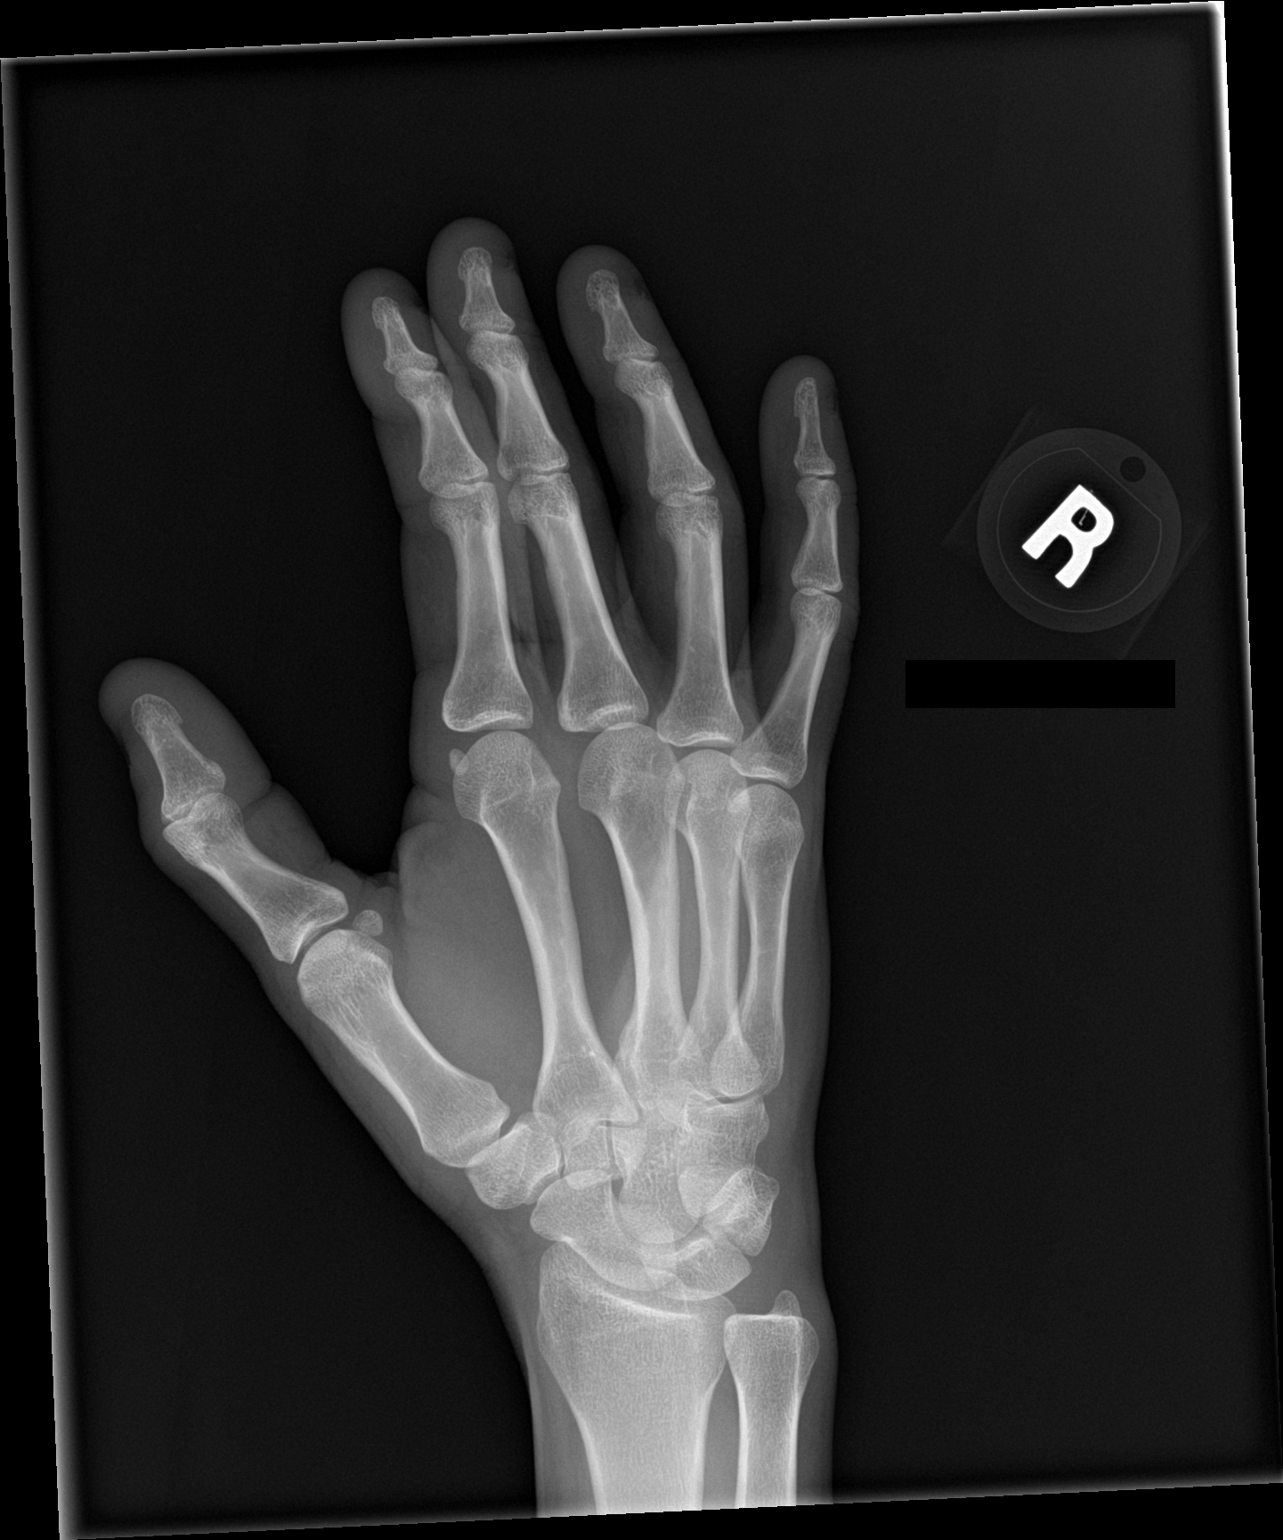

[hand lat]
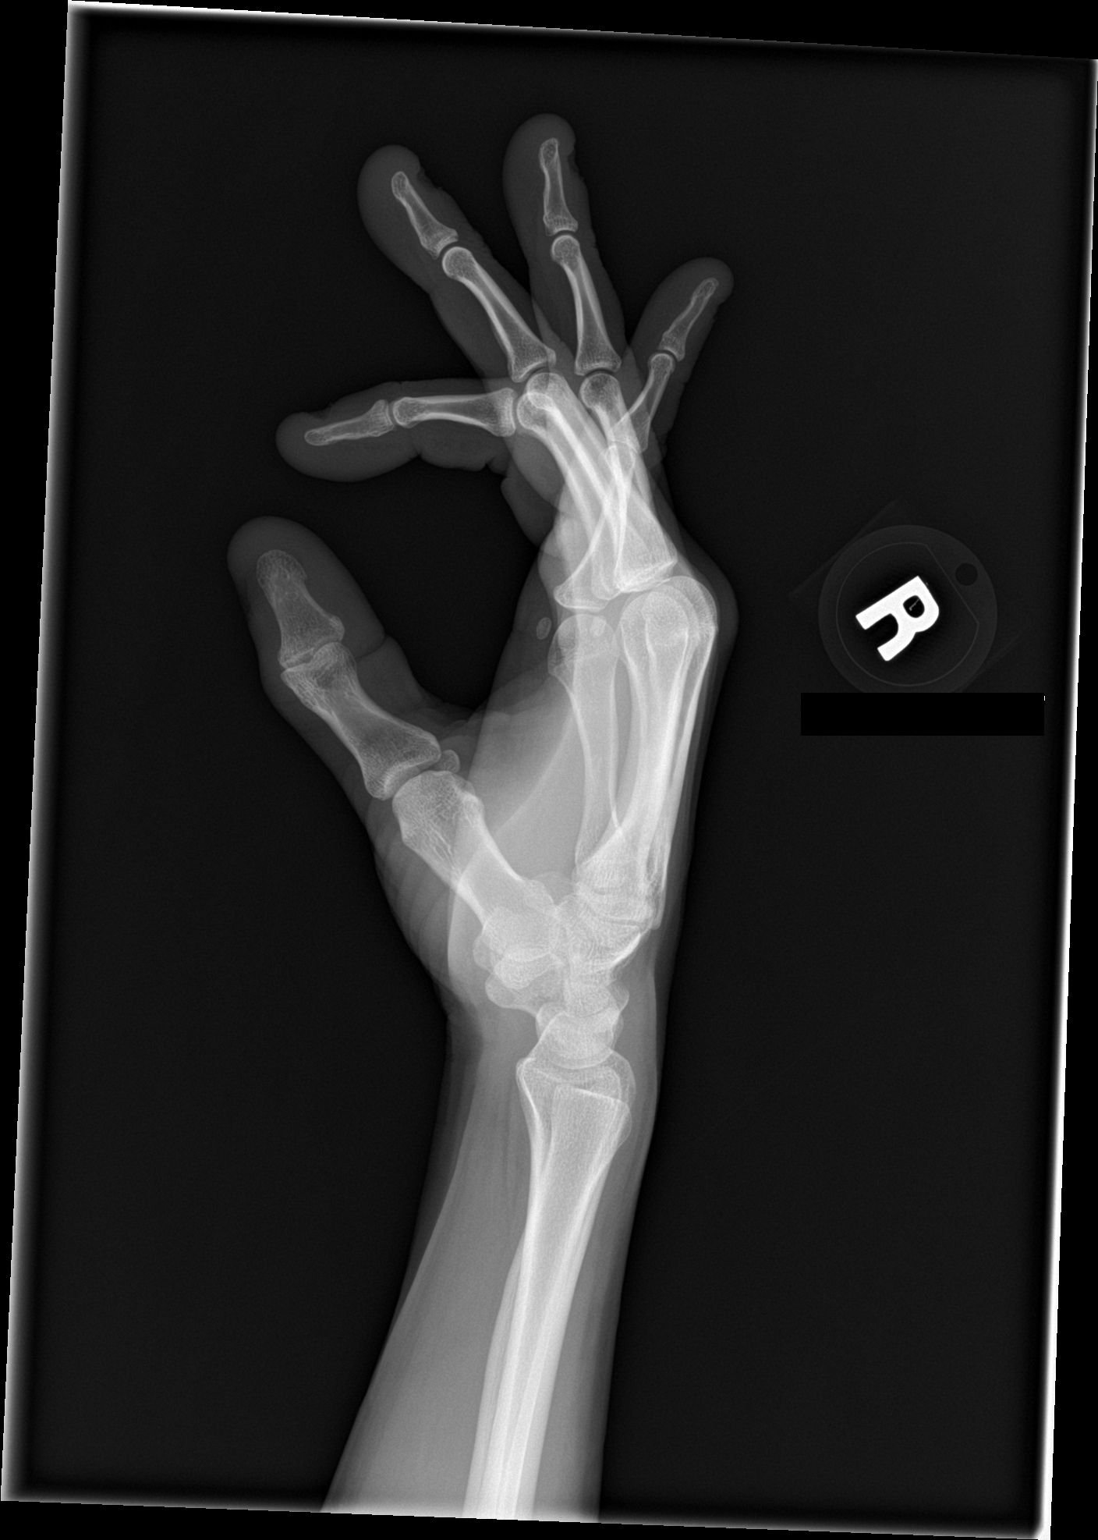

[3 of 3 positions shown; findings below may reference images not displayed]

FINDINGS: There is no evidence of fracture or dislocation. There is no
evidence of arthropathy or other focal bone abnormality. Soft
tissues are unremarkable.
IMPRESSION: Negative.

## 2020-07-13 IMAGING — CR CHEST - 2 VIEW
1 series · 2 of 2 positions shown · non-contrast
Comparison: Radiographs April 05, 2016.

CLINICAL DATA: Chest pain.

EXAM:
CHEST - 2 VIEW

[Series 1: dg chest 2 view · 0.14mm/px · 2 of 2 slices shown]
[im 1/2]
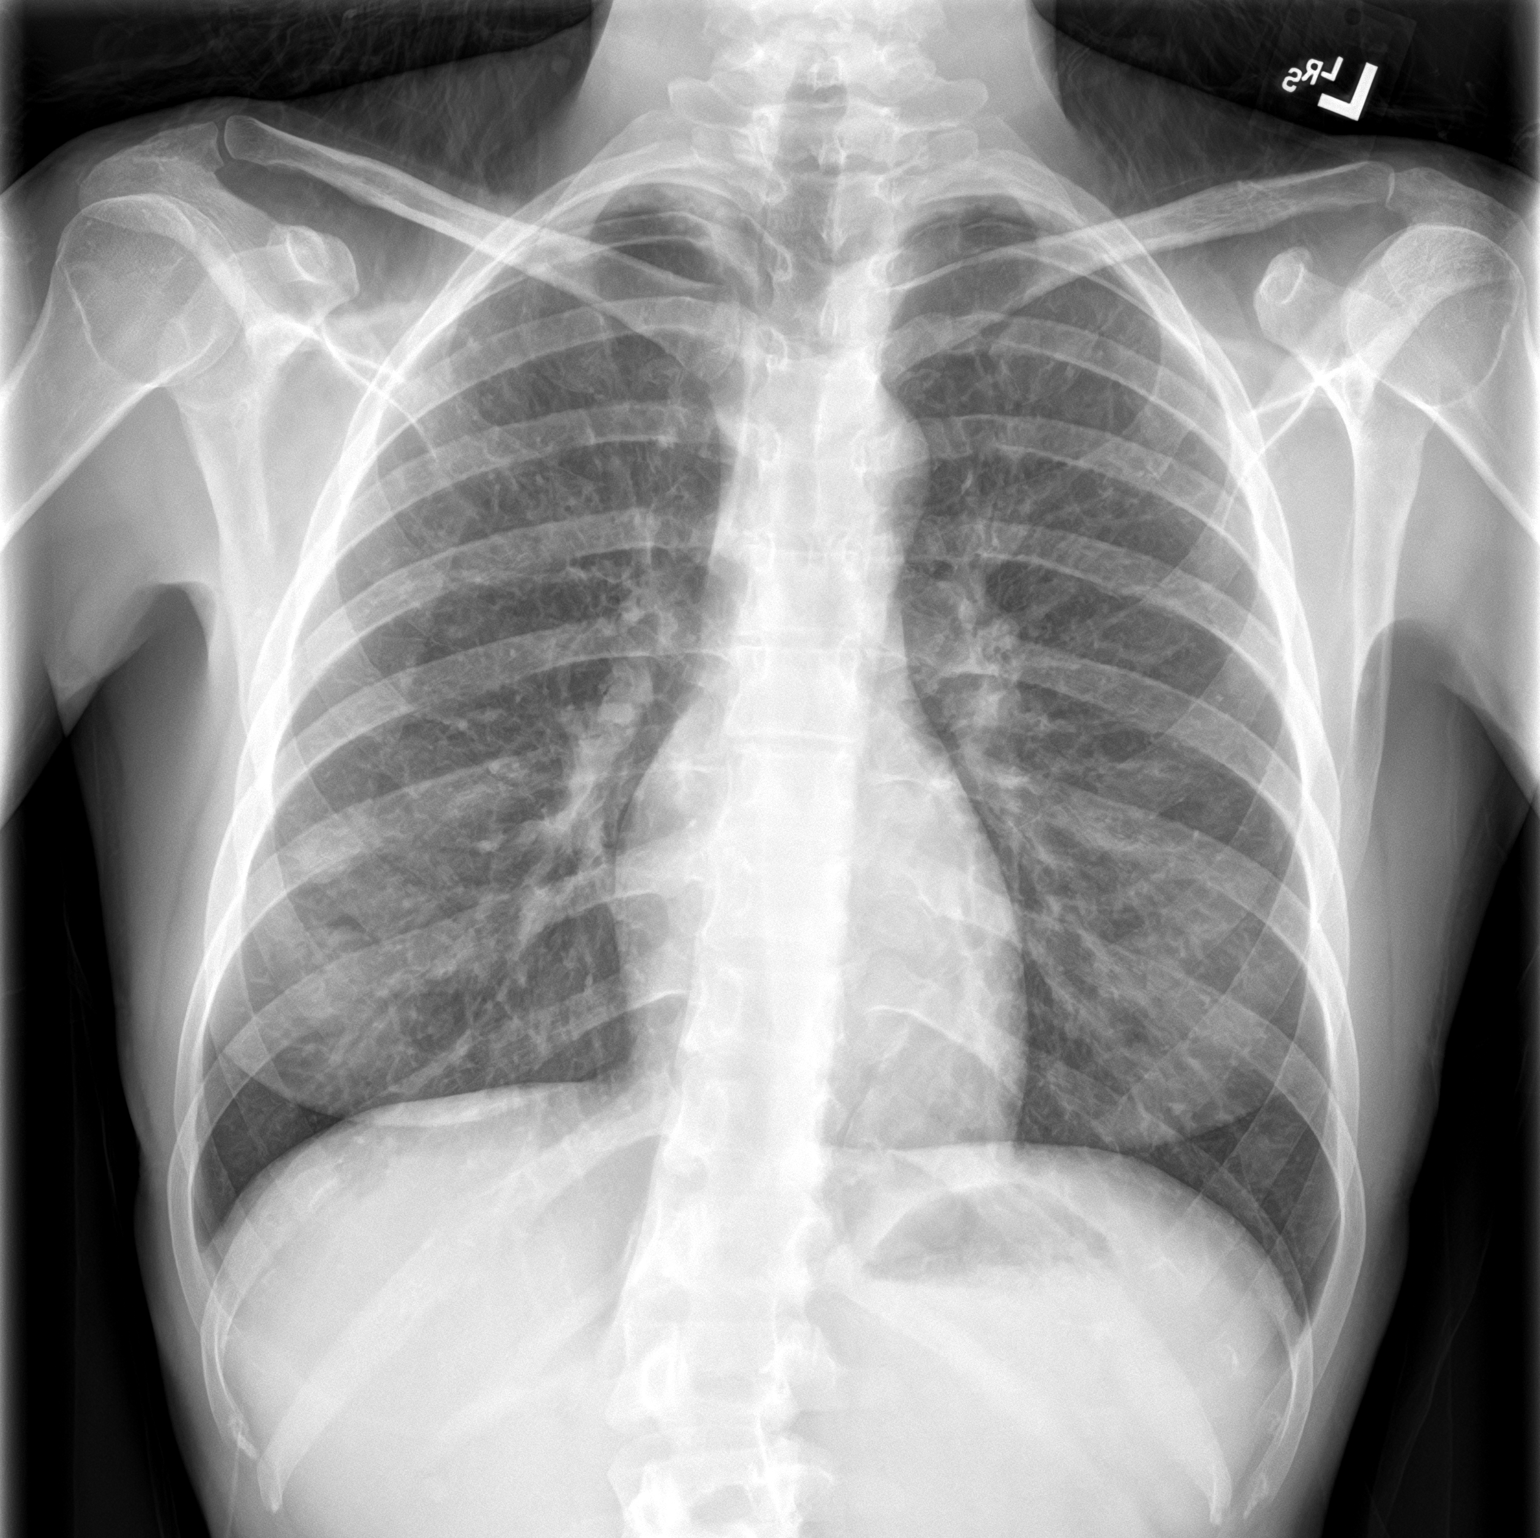
[im 2/2]
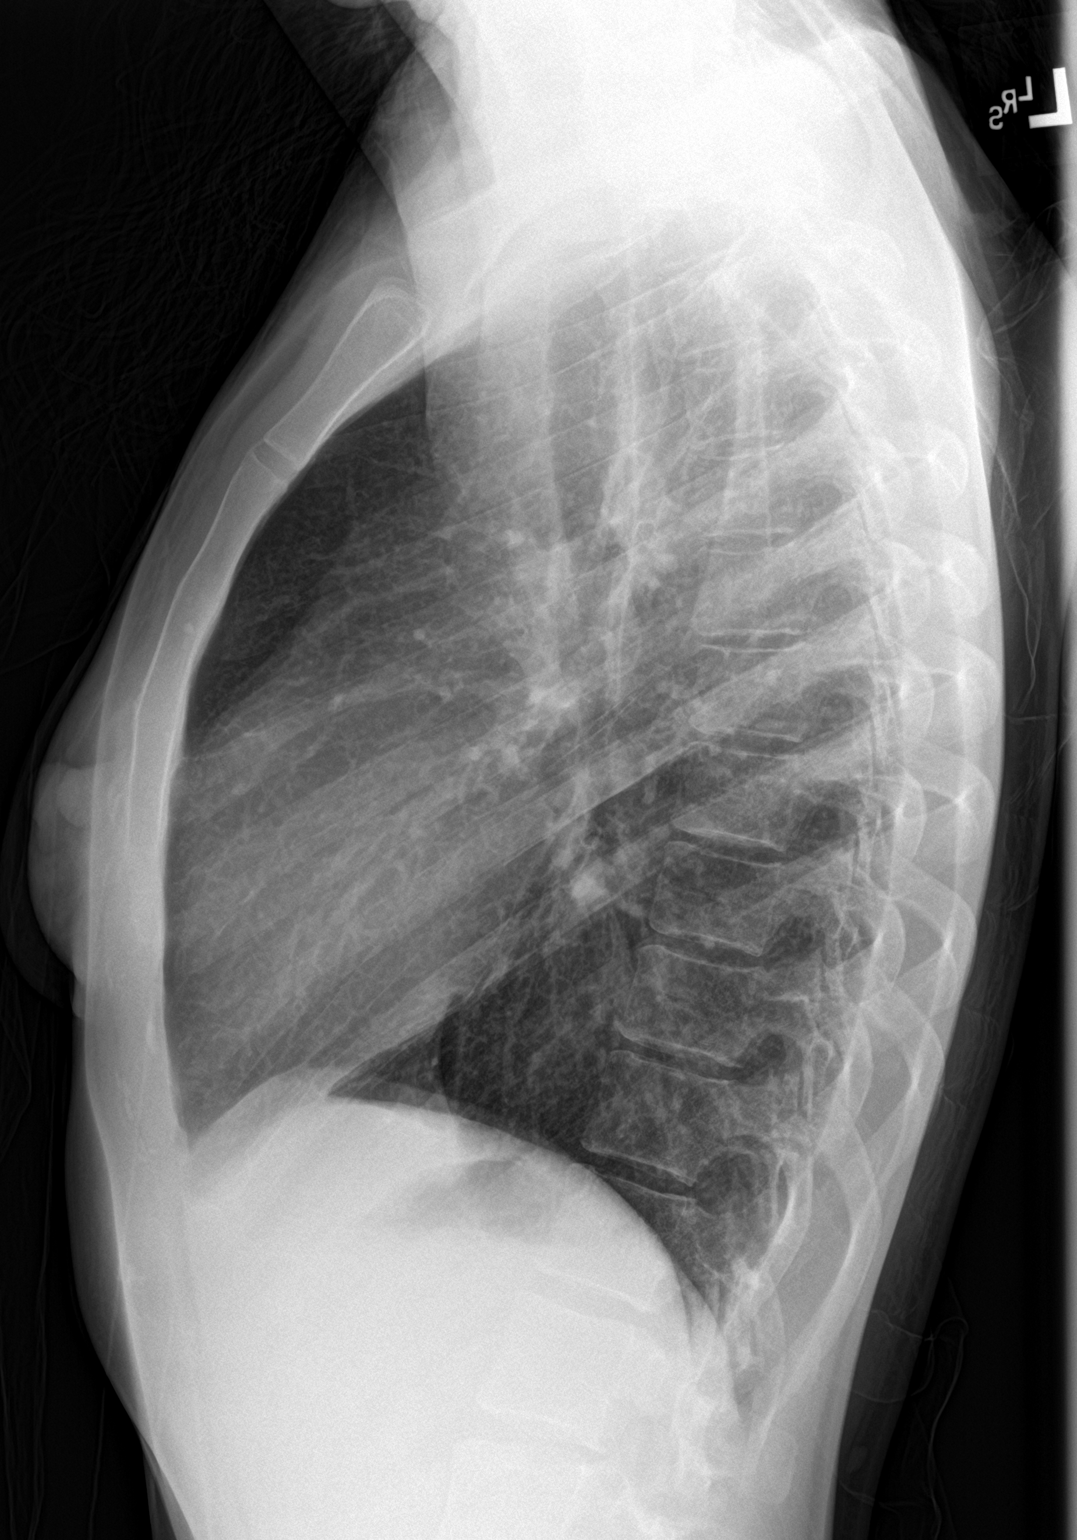

[2 of 2 positions shown; findings below may reference images not displayed]

FINDINGS: The heart size and mediastinal contours are within normal limits.
Both lungs are clear. No pneumothorax or pleural effusion is noted.
The visualized skeletal structures are unremarkable.
IMPRESSION: No active cardiopulmonary disease.

## 2020-07-13 IMAGING — US RIGHT LOWER EXTREMITY VENOUS ULTRASOUND
1 series · 13 of 24 positions shown · non-contrast
Comparison: None.

CLINICAL DATA: 27-year-old female with a history calf pain



[Series 1: right lower extremity venous ultrasound · 13 of 44 slices shown]
[im 1/44]
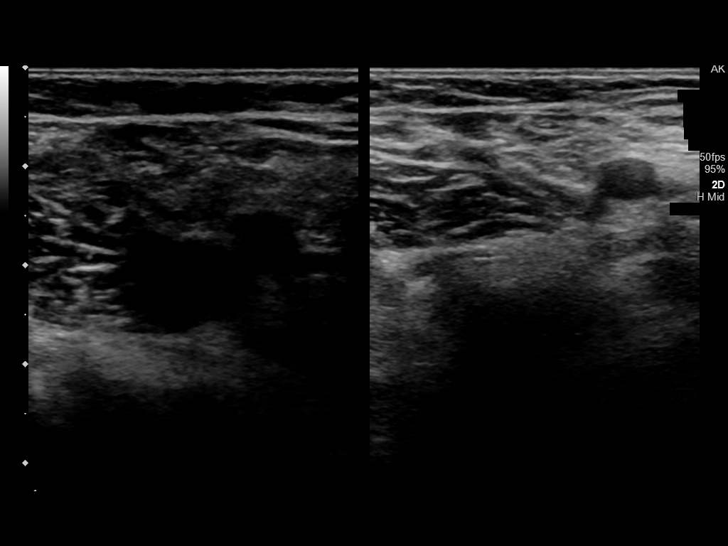
[im 4/44]
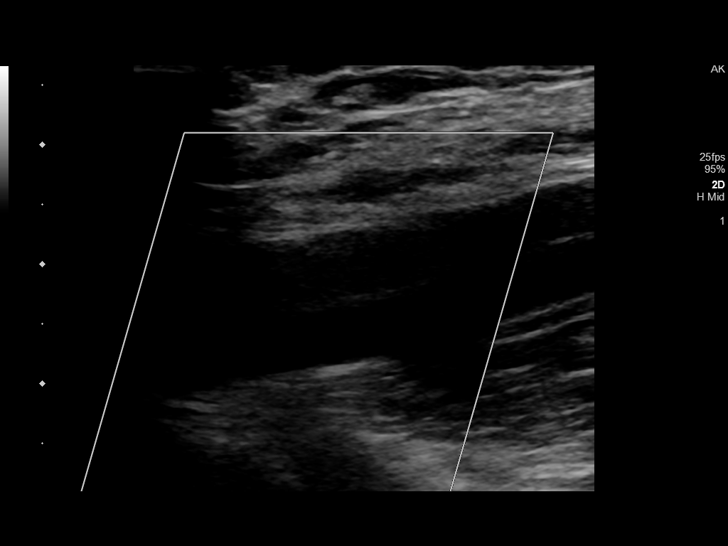
[im 8/44]
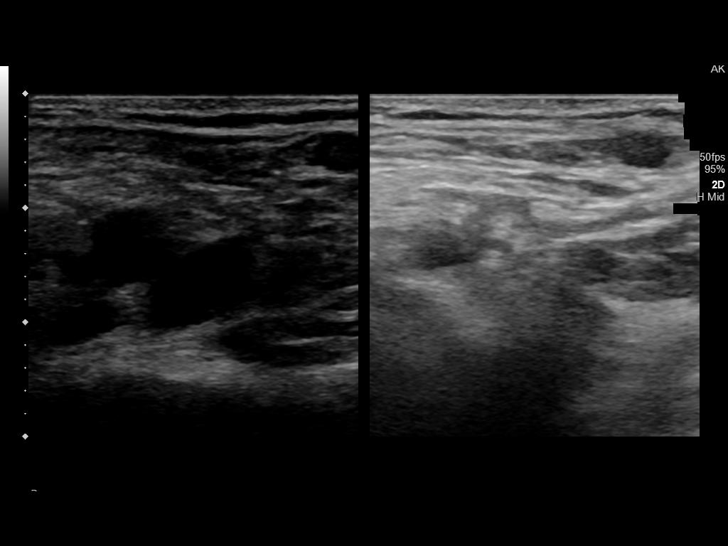
[im 12/44]
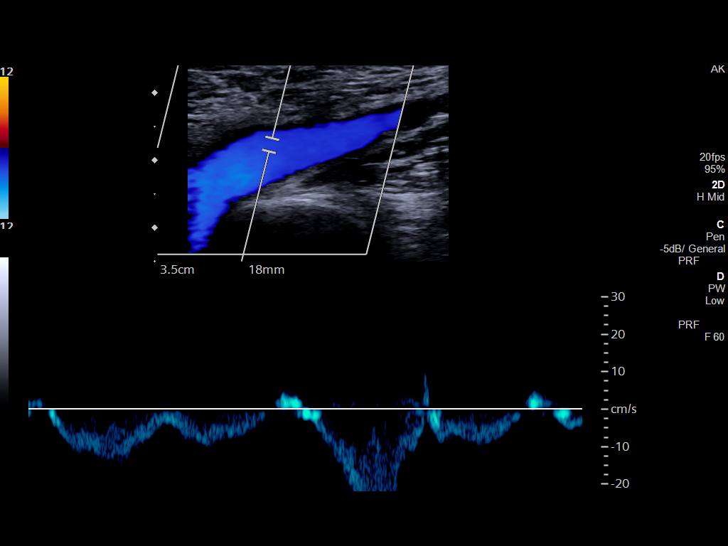
[im 15/44]
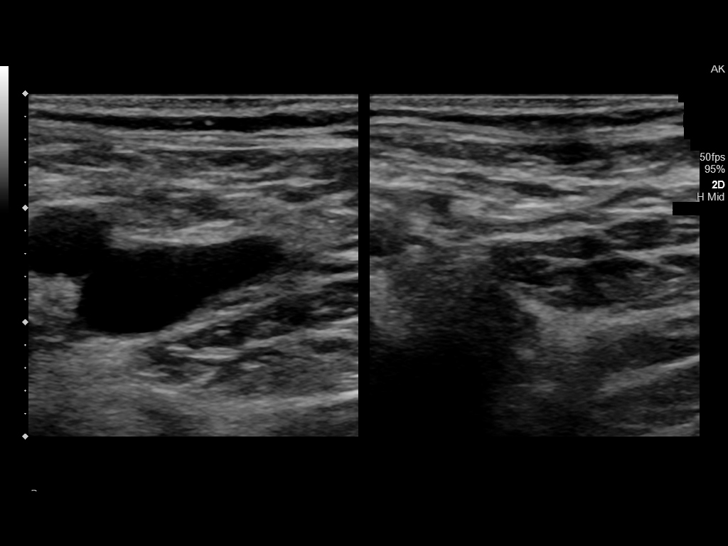
[im 19/44]
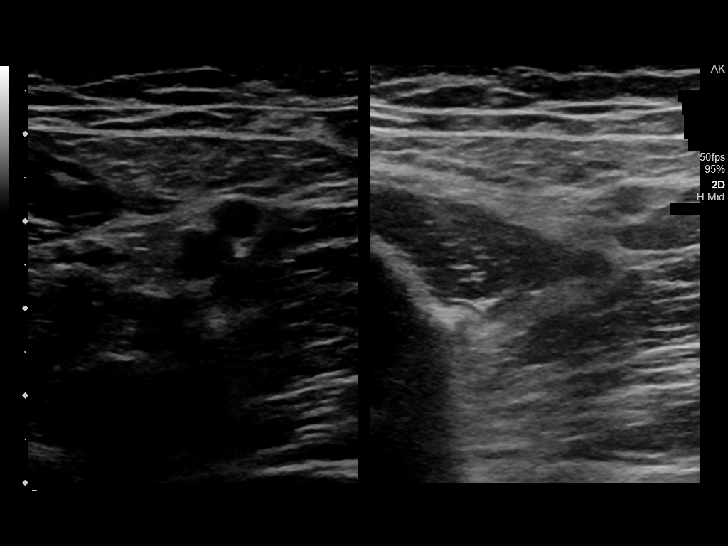
[im 23/44]
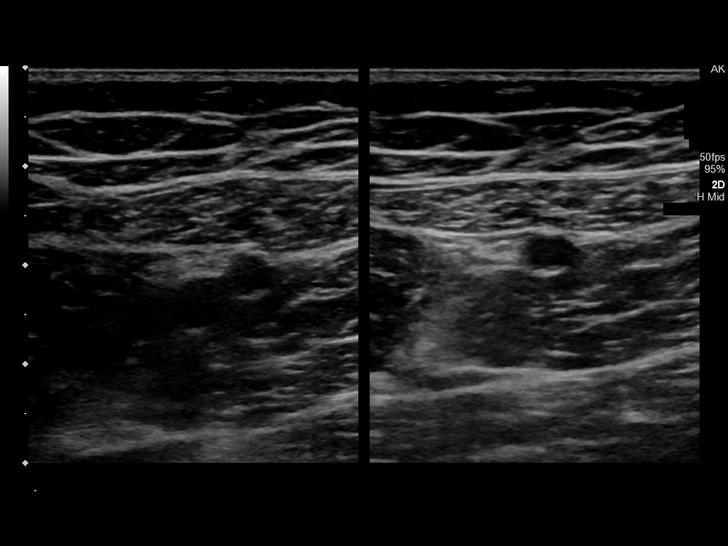
[im 25/44]
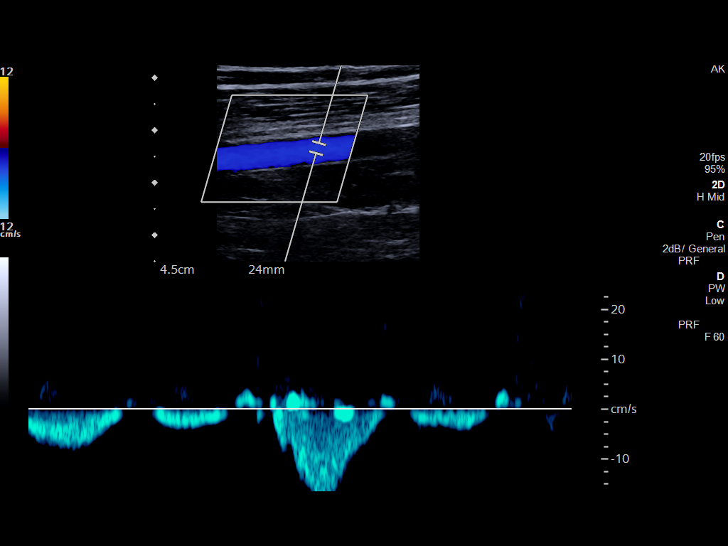
[im 29/44]
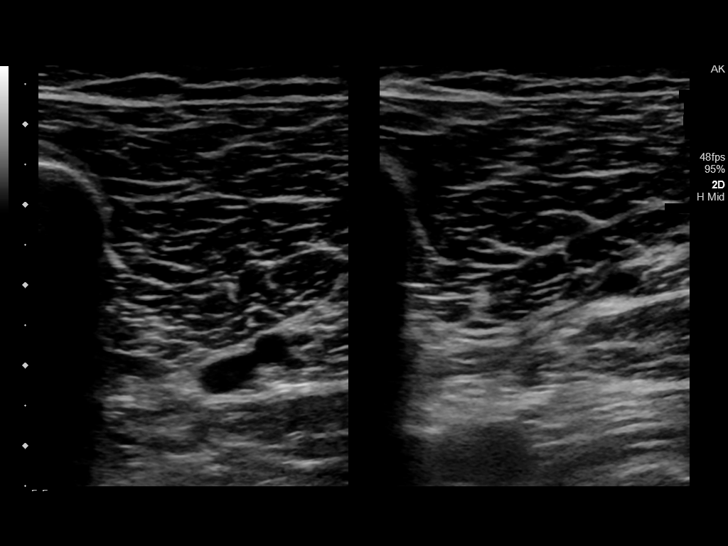
[im 32/44]
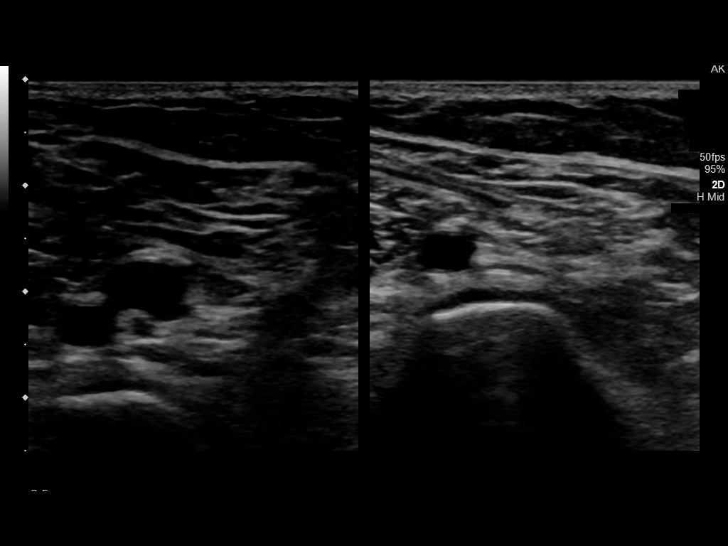
[im 36/44]
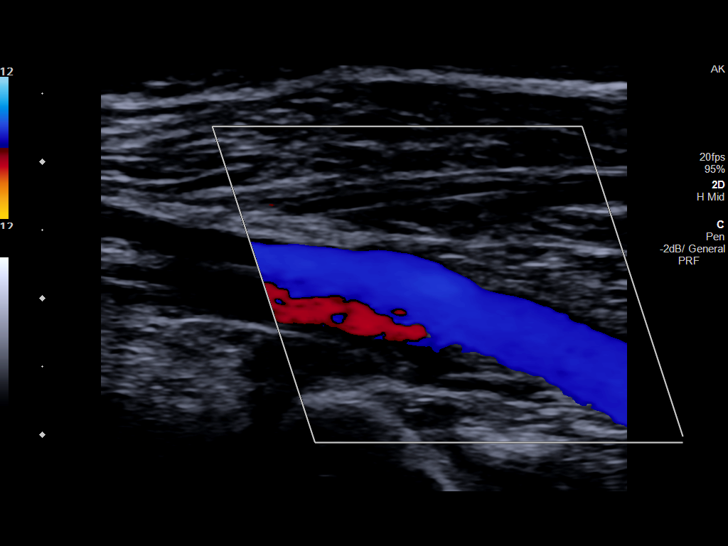
[im 40/44]
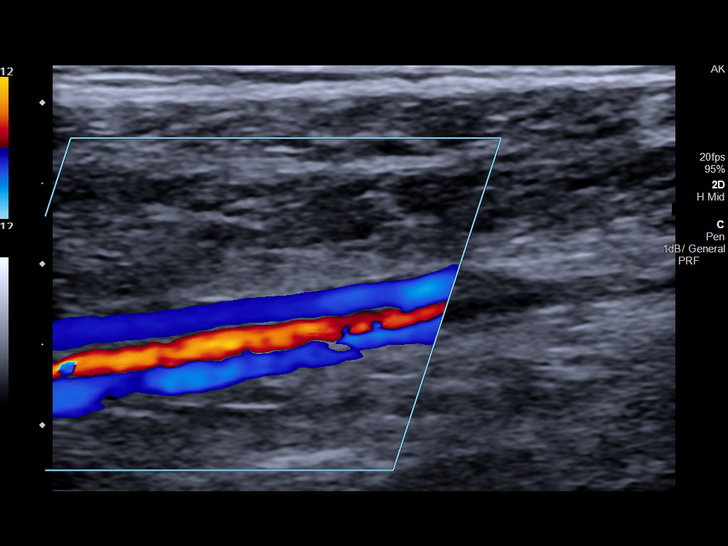
[im 44/44]
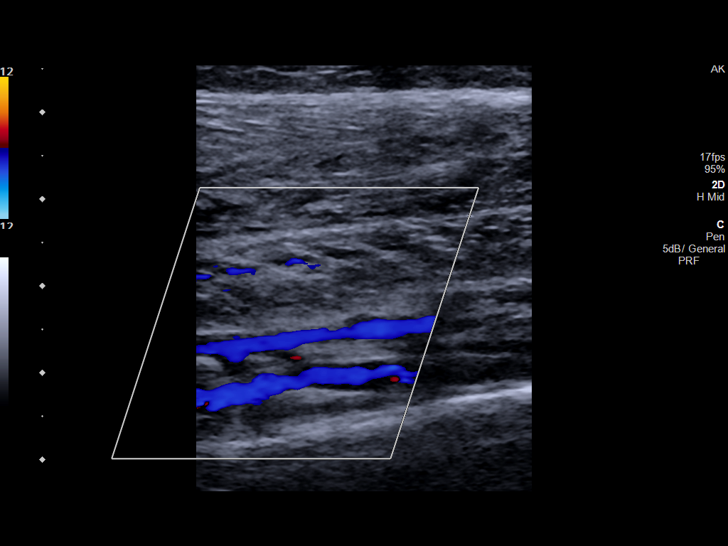

[13 of 24 positions shown; findings below may reference images not displayed]

FINDINGS: Contralateral Common Femoral Vein: Respiratory phasicity is normal
and symmetric with the symptomatic side. No evidence of thrombus.
Normal compressibility.

Common Femoral Vein: No evidence of thrombus. Normal
compressibility, respiratory phasicity and response to augmentation.

Saphenofemoral Junction: No evidence of thrombus. Normal
compressibility and flow on color Doppler imaging.

Profunda Femoral Vein: No evidence of thrombus. Normal
compressibility and flow on color Doppler imaging.

Femoral Vein: No evidence of thrombus. Normal compressibility,
respiratory phasicity and response to augmentation.

Popliteal Vein: No evidence of thrombus. Normal compressibility,
respiratory phasicity and response to augmentation.

Calf Veins: No evidence of thrombus. Normal compressibility and flow
on color Doppler imaging.

Superficial Great Saphenous Vein: No evidence of thrombus. Normal
compressibility and flow on color Doppler imaging.

Other Findings:  None.
IMPRESSION: Sonographic survey of the right lower extremity negative for DVT

## 2021-03-29 ENCOUNTER — Ambulatory Visit
Admission: EM | Admit: 2021-03-29 | Discharge: 2021-03-29 | Disposition: A | Discharge status: Home / Self Care | Payer: Medicaid Other | Attending: Emergency Medicine | Admitting: Emergency Medicine

## 2021-03-29 ENCOUNTER — Encounter: Payer: Self-pay | Admitting: Emergency Medicine

## 2021-03-29 ENCOUNTER — Other Ambulatory Visit: Payer: Self-pay

## 2021-03-29 DIAGNOSIS — J020 Streptococcal pharyngitis: Secondary | ICD-10-CM | POA: Insufficient documentation

## 2021-03-29 DIAGNOSIS — J309 Allergic rhinitis, unspecified: Secondary | ICD-10-CM

## 2021-03-29 DIAGNOSIS — H6983 Other specified disorders of Eustachian tube, bilateral: Secondary | ICD-10-CM

## 2021-03-29 LAB — GROUP A STREP BY PCR: Group A Strep by PCR: NOT DETECTED

## 2021-03-29 MED ORDER — MAGIC MOUTHWASH W/LIDOCAINE: 5.0000 mL | Freq: Four times a day (QID) | ORAL | 0 refills | Status: DC

## 2021-03-29 MED ORDER — FLUTICASONE PROPIONATE 50 MCG/ACT NA SUSP: 1.0000 | Freq: Two times a day (BID) | NASAL | 0 refills | Status: DC

## 2021-03-29 MED ORDER — CETIRIZINE HCL 10 MG PO TABS: 10.0000 mg | ORAL_TABLET | Freq: Every day | ORAL | 0 refills | Status: DC

## 2021-03-29 MED ORDER — AMOXICILLIN 875 MG PO TABS: 875.0000 mg | ORAL_TABLET | Freq: Two times a day (BID) | ORAL | 0 refills | Status: DC

## 2021-03-29 NOTE — ED Triage Notes (Signed)
Pt c/o sore throat, vomiting, white patches on throat. Started yesterday. Denies fever, or body aches.

## 2021-03-29 NOTE — ED Provider Notes (Signed)
[facility]  ____________________________________________  Time seen: Approximately 8:57 AM  I have reviewed the triage vital signs and the nursing notes.   HISTORY  Chief Complaint Sore Throat    HPI Andrea Villarreal is a 30 y.o. female who presents to the urgent care complaining of sore throat primarily.  Patient has also had some decreased hearing bilaterally worse on the left than right for several weeks.  She does have some allergies but has not been taking any medications.  Patient endorses sore throat, white spots on her tonsils.  No reported fevers, chills, cough, GI symptoms.  No known sick contacts.       History reviewed. No pertinent past medical history.  There are no problems to display for this patient.   Past Surgical History:  Procedure Laterality Date   TUBAL LIGATION      Prior to Admission medications   Medication Sig Start Date End Date Taking? Authorizing Provider  amoxicillin (AMOXIL) 875 MG tablet Take 1 tablet (875 mg total) by mouth 2 (two) times daily. 03/29/21  Yes Jacarie Pate, Charline Bills, PA-C  cetirizine (ZYRTEC) 10 MG tablet Take 1 tablet (10 mg total) by mouth daily. 03/29/21  Yes Loriana Samad, Charline Bills, PA-C  fluticasone (FLONASE) 50 MCG/ACT nasal spray Place 1 spray into both nostrils 2 (two) times daily. 03/29/21  Yes Libni Fusaro, Charline Bills, PA-C  magic mouthwash w/lidocaine SOLN Take 5 mLs by mouth 4 (four) times daily. 03/29/21  Yes Enolia Koepke, Charline Bills, PA-C  diphenoxylate-atropine (LOMOTIL) 2.5-0.025 MG tablet Take 1 tablet by mouth 4 (four) times daily as needed for diarrhea or loose stools. 01/24/19   Coral Spikes, DO    Allergies Patient has no known allergies.  No family history on file.  Social History Social History   Tobacco Use   Smoking status: Every Day    Packs/day: 0.50    Types: Cigarettes   Smokeless tobacco: Never  Vaping Use   Vaping Use: Never used  Substance Use Topics   Alcohol use: No   Drug  use: No     Review of Systems  Constitutional: No fever/chills Eyes: No visual changes. No discharge ENT: Sore throat, allergic rhinitis with decreased hearing bilaterally Cardiovascular: no chest pain. Respiratory: no cough. No SOB. Gastrointestinal: No abdominal pain.  No nausea, no vomiting.  No diarrhea.  No constipation. Musculoskeletal: Negative for musculoskeletal pain. Skin: Negative for rash, abrasions, lacerations, ecchymosis. Neurological: Negative for headaches, focal weakness or numbness.  10 System ROS otherwise negative.  ____________________________________________   PHYSICAL EXAM:  VITAL SIGNS: ED Triage Vitals  Enc Vitals Group     BP 03/29/21 0836 104/64     Pulse Rate 03/29/21 0836 74     Resp 03/29/21 0836 18     Temp 03/29/21 0836 98.5 F (36.9 C)     Temp Source 03/29/21 0836 Oral     SpO2 03/29/21 0836 100 %     Weight 03/29/21 0834 106 lb 14.8 oz (48.5 kg)     Height 03/29/21 0834 5' (1.524 m)     Head Circumference --      Peak Flow --      Pain Score 03/29/21 0833 4     Pain Loc --      Pain Edu? --      Excl. in [place]? --      Constitutional: Alert and oriented. Well appearing and in no acute distress. Eyes: Conjunctivae are normal. PERRL. EOMI. Head: Atraumatic. ENT:  Ears: EACs unremarkable bilaterally.  TMs are moderately bulging bilaterally with no injection.      Nose: No congestion/rhinnorhea.  Turbinates are boggy.      Mouth/Throat: Mucous membranes are moist.  Tonsils are erythematous, edematous bilaterally.  Equal tonsillar hypertrophy.  No uvular deviation.  Exudates present. Neck: No stridor.  Neck is supple full range of motion, no tenderness Hematological/Lymphatic/Immunilogical: No cervical lymphadenopathy. Cardiovascular: Normal rate, regular rhythm. Normal S1 and S2.  Good peripheral circulation. Respiratory: Normal respiratory effort without tachypnea or retractions. Lungs CTAB. Good air entry to the bases with no  decreased or absent breath sounds. Musculoskeletal: Full range of motion to all extremities. No gross deformities appreciated. Neurologic:  Normal speech and language. No gross focal neurologic deficits are appreciated.  Skin:  Skin is warm, dry and intact. No rash noted. Psychiatric: Mood and affect are normal. Speech and behavior are normal. Patient exhibits appropriate insight and judgement.   ____________________________________________   LABS (all labs ordered are listed, but only abnormal results are displayed)  Labs Reviewed  GROUP A STREP BY PCR   ____________________________________________  EKG   ____________________________________________  RADIOLOGY   No results found.  ____________________________________________    PROCEDURES  Procedure(s) performed:    Procedures    Medications - No data to display   ____________________________________________   INITIAL IMPRESSION / ASSESSMENT AND PLAN / ED COURSE  Pertinent labs & imaging results that were available during my care of the patient were reviewed by me and considered in my medical decision making (see chart for details).  Review of the [place] CSRS was performed in accordance of the [place] prior to dispensing any controlled drugs.           Patient's diagnosis is consistent with strep pharyngitis.  Patient presents to the urgent care complaining of sore throat.  Patient will be diagnosed with strep clinically and started on antibiotics.  Patient also has findings consistent with allergic rhinitis with eustachian tube dysfunction.  Patient was placed on Flonase and Zyrtec for same.  Follow-up with primary care as needed..  Patient is given ED precautions to return to the ED for any worsening or new symptoms.     ____________________________________________  FINAL CLINICAL IMPRESSION(S) / DIAGNOSES  Final diagnoses:  Strep throat  Eustachian tube dysfunction, bilateral  Allergic rhinitis,  unspecified seasonality, unspecified trigger      NEW MEDICATIONS STARTED DURING THIS VISIT:  ED Discharge Orders          Ordered    fluticasone (FLONASE) 50 MCG/ACT nasal spray  2 times daily        03/29/21 0902    cetirizine (ZYRTEC) 10 MG tablet  Daily        03/29/21 0902    amoxicillin (AMOXIL) 875 MG tablet  2 times daily        03/29/21 0902    magic mouthwash w/lidocaine SOLN  4 times daily       Note to Pharmacy: Dispense in a 1/1/1 ratio. Use lidocaine, diphenhydramine, prednisolone   03/29/21 0902                This chart was dictated using voice recognition software/Dragon. Despite best efforts to proofread, errors can occur which can change the meaning. Any change was purely unintentional.    Darletta Moll, PA-C 03/29/21 [phone]

## 2021-05-16 ENCOUNTER — Inpatient Hospital Stay
Admission: RE | Admit: 2021-05-16 | Discharge: 2021-05-16 | Disposition: A | Discharge status: Home / Self Care | Payer: Self-pay | Source: Ambulatory Visit

## 2021-05-16 ENCOUNTER — Encounter: Payer: Self-pay | Admitting: Emergency Medicine

## 2021-05-16 ENCOUNTER — Other Ambulatory Visit: Payer: Self-pay

## 2021-05-16 ENCOUNTER — Ambulatory Visit
Admission: EM | Admit: 2021-05-16 | Discharge: 2021-05-16 | Disposition: A | Discharge status: Home / Self Care | Payer: Medicaid Other | Attending: Physician Assistant | Admitting: Physician Assistant

## 2021-05-16 DIAGNOSIS — J209 Acute bronchitis, unspecified: Secondary | ICD-10-CM

## 2021-05-16 MED ORDER — PREDNISONE 50 MG PO TABS: ORAL_TABLET | ORAL | 0 refills | Status: DC

## 2021-05-16 MED ORDER — ALBUTEROL SULFATE HFA 108 (90 BASE) MCG/ACT IN AERS: 2.0000 | INHALATION_SPRAY | Freq: Four times a day (QID) | RESPIRATORY_TRACT | 2 refills | Status: AC | PRN

## 2021-05-16 NOTE — Discharge Instructions (Addendum)
Return if any problems.

## 2021-05-16 NOTE — ED Triage Notes (Signed)
Pt c/o cough, chest congestion, SOB and chest pain x 2 weeks.

## 2021-05-24 NOTE — ED Provider Notes (Signed)
[facility]    CSN: [id number] Arrival date & time: 05/16/21  1656      History   Chief Complaint Chief Complaint  Patient presents with   Cough   Shortness of Breath    HPI Andrea Villarreal is a 31 y.o. female.   The history is provided by the patient. No language interpreter was used.  Cough Cough characteristics:  Non-productive Sputum characteristics:  Nondescript Severity:  Moderate Timing:  Constant Progression:  Worsening Chronicity:  New Relieved by:  Nothing Worsened by:  Nothing Associated symptoms: shortness of breath   Shortness of Breath Associated symptoms: cough    History reviewed. No pertinent past medical history.  There are no problems to display for this patient.   Past Surgical History:  Procedure Laterality Date   TUBAL LIGATION      OB History   No obstetric history on file.      Home Medications    Prior to Admission medications   Medication Sig Start Date End Date Taking? Authorizing Provider  albuterol (VENTOLIN HFA) 108 (90 Base) MCG/ACT inhaler Inhale 2 puffs into the lungs every 6 (six) hours as needed for wheezing or shortness of breath. 05/16/21  Yes Caryl Ada K, PA-C  predniSONE (DELTASONE) 50 MG tablet One tablet a day 05/16/21  Yes Fransico Meadow, PA-C  amoxicillin (AMOXIL) 875 MG tablet Take 1 tablet (875 mg total) by mouth 2 (two) times daily. 03/29/21   Cuthriell, Charline Bills, PA-C  cetirizine (ZYRTEC) 10 MG tablet Take 1 tablet (10 mg total) by mouth daily. 03/29/21   Cuthriell, Charline Bills, PA-C  diphenoxylate-atropine (LOMOTIL) 2.5-0.025 MG tablet Take 1 tablet by mouth 4 (four) times daily as needed for diarrhea or loose stools. 01/24/19   Cook, Barnie Del, DO  fluticasone (FLONASE) 50 MCG/ACT nasal spray Place 1 spray into both nostrils 2 (two) times daily. 03/29/21   Cuthriell, Charline Bills, PA-C  magic mouthwash w/lidocaine SOLN Take 5 mLs by mouth 4 (four) times daily. 03/29/21   Cuthriell, Charline Bills,  PA-C    Family History History reviewed. No pertinent family history.  Social History Social History   Tobacco Use   Smoking status: Every Day    Packs/day: 0.50    Types: Cigarettes   Smokeless tobacco: Never  Vaping Use   Vaping Use: Some days  Substance Use Topics   Alcohol use: No   Drug use: No     Allergies   Patient has no known allergies.   Review of Systems Review of Systems  Respiratory:  Positive for cough and shortness of breath.   All other systems reviewed and are negative.   Physical Exam Triage Vital Signs ED Triage Vitals  Enc Vitals Group     BP 05/16/21 1726 117/68     Pulse Rate 05/16/21 1726 91     Resp 05/16/21 1726 18     Temp 05/16/21 1726 98.6 F (37 C)     Temp Source 05/16/21 1726 Oral     SpO2 05/16/21 1726 97 %     Weight --      Height --      Head Circumference --      Peak Flow --      Pain Score 05/16/21 1727 5     Pain Loc --      Pain Edu? --      Excl. in [place]? --    No data found.  Updated Vital Signs BP 117/68 (BP  Location: Left Arm)    Pulse 91    Temp 98.6 F (37 C) (Oral)    Resp 18    LMP 05/11/2021    SpO2 97%   Visual Acuity Right Eye Distance:   Left Eye Distance:   Bilateral Distance:    Right Eye Near:   Left Eye Near:    Bilateral Near:     Physical Exam Vitals and nursing note reviewed.  Constitutional:      Appearance: She is well-developed.  HENT:     Head: Normocephalic.  Cardiovascular:     Rate and Rhythm: Normal rate.  Pulmonary:     Effort: Pulmonary effort is normal.  Abdominal:     General: There is no distension.  Musculoskeletal:        General: Normal range of motion.     Cervical back: Normal range of motion.  Skin:    General: Skin is warm.  Neurological:     Mental Status: She is alert and oriented to person, place, and time.     UC Treatments / Results  Labs (all labs ordered are listed, but only abnormal results are displayed) Labs Reviewed - No data to  display  EKG   Radiology No results found.  Procedures Procedures (including critical care time)  Medications Ordered in UC Medications - No data to display  Initial Impression / Assessment and Plan / UC Course  I have reviewed the triage vital signs and the nursing notes.  Pertinent labs & imaging results that were available during my care of the patient were reviewed by me and considered in my medical decision making (see chart for details).      Final Clinical Impressions(s) / UC Diagnoses   Final diagnoses:  Acute bronchitis, unspecified organism     Discharge Instructions      Return if any problems.    ED Prescriptions     Medication Sig Dispense Auth. Provider   predniSONE (DELTASONE) 50 MG tablet One tablet a day 6 tablet Helena Sardo K, PA-C   albuterol (VENTOLIN HFA) 108 (90 Base) MCG/ACT inhaler Inhale 2 puffs into the lungs every 6 (six) hours as needed for wheezing or shortness of breath. 8 g Fransico Meadow, [place]      PDMP not reviewed this encounter.   Fransico Meadow, [place] 05/24/21 [phone]

## 2021-11-03 ENCOUNTER — Ambulatory Visit
Admission: EM | Admit: 2021-11-03 | Discharge: 2021-11-03 | Disposition: A | Discharge status: Home / Self Care | Payer: Self-pay | Attending: Emergency Medicine | Admitting: Emergency Medicine

## 2021-11-03 ENCOUNTER — Ambulatory Visit (INDEPENDENT_AMBULATORY_CARE_PROVIDER_SITE_OTHER): Payer: Self-pay

## 2021-11-03 ENCOUNTER — Encounter: Payer: Self-pay | Admitting: Emergency Medicine

## 2021-11-03 DIAGNOSIS — R079 Chest pain, unspecified: Secondary | ICD-10-CM

## 2021-11-03 DIAGNOSIS — B3731 Acute candidiasis of vulva and vagina: Secondary | ICD-10-CM

## 2021-11-03 DIAGNOSIS — N760 Acute vaginitis: Secondary | ICD-10-CM | POA: Insufficient documentation

## 2021-11-03 DIAGNOSIS — R059 Cough, unspecified: Secondary | ICD-10-CM

## 2021-11-03 DIAGNOSIS — K5900 Constipation, unspecified: Secondary | ICD-10-CM

## 2021-11-03 DIAGNOSIS — N3000 Acute cystitis without hematuria: Secondary | ICD-10-CM

## 2021-11-03 DIAGNOSIS — B9689 Other specified bacterial agents as the cause of diseases classified elsewhere: Secondary | ICD-10-CM

## 2021-11-03 DIAGNOSIS — R1032 Left lower quadrant pain: Secondary | ICD-10-CM

## 2021-11-03 LAB — WET PREP, GENITAL
Sperm: NONE SEEN
Trich, Wet Prep: NONE SEEN
WBC, Wet Prep HPF POC: <10 — AB (ref <10)

## 2021-11-03 LAB — CBC WITH DIFFERENTIAL/PLATELET
Abs Immature Granulocytes: 0.01 10*3/uL (ref 0.00–0.07)
Basophils Absolute: 0.1 10*3/uL (ref 0.0–0.1)
Basophils Relative: 1 %
Eosinophils Absolute: 0.1 10*3/uL (ref 0.0–0.5)
Eosinophils Relative: 2 %
HCT: 37.4 % (ref 36.0–46.0)
Hemoglobin: 12.5 g/dL (ref 12.0–15.0)
Immature Granulocytes: 0 %
Lymphocytes Relative: 46 %
Lymphs Abs: 2.8 10*3/uL (ref 0.7–4.0)
MCH: 28.5 pg (ref 26.0–34.0)
MCHC: 33.4 g/dL (ref 30.0–36.0)
MCV: 85.4 fL (ref 80.0–100.0)
Monocytes Absolute: 0.4 10*3/uL (ref 0.1–1.0)
Monocytes Relative: 7 %
Neutro Abs: 2.7 10*3/uL (ref 1.7–7.7)
Neutrophils Relative %: 44 %
Platelets: 241 10*3/uL (ref 150–400)
RBC: 4.38 MIL/uL (ref 3.87–5.11)
RDW: 14.9 % (ref 11.5–15.5)
WBC: 6.2 10*3/uL (ref 4.0–10.5)
nRBC: 0 % (ref 0.0–0.2)

## 2021-11-03 LAB — COMPREHENSIVE METABOLIC PANEL
ALT: 10 U/L (ref 0–44)
AST: 17 U/L (ref 15–41)
Albumin: 4.4 g/dL (ref 3.5–5.0)
Alkaline Phosphatase: 60 U/L (ref 38–126)
Anion gap: 5 (ref 5–15)
BUN: 8 mg/dL (ref 6–20)
CO2: 28 mmol/L (ref 22–32)
Calcium: 8.9 mg/dL (ref 8.9–10.3)
Chloride: 104 mmol/L (ref 98–111)
Creatinine, Ser: 0.77 mg/dL (ref 0.44–1.00)
GFR, Estimated: >60 mL/min (ref >60)
Glucose, Bld: 92 mg/dL (ref 70–99)
Potassium: 3.2 mmol/L — ABNORMAL LOW (ref 3.5–5.1)
Sodium: 137 mmol/L (ref 135–145)
Total Bilirubin: 0.6 mg/dL (ref 0.3–1.2)
Total Protein: 7.2 g/dL (ref 6.5–8.1)

## 2021-11-03 LAB — URINALYSIS, ROUTINE W REFLEX MICROSCOPIC
Bilirubin Urine: NEGATIVE
Glucose, UA: NEGATIVE mg/dL
Ketones, ur: 15 mg/dL — AB
Leukocytes,Ua: NEGATIVE
Nitrite: POSITIVE — AB
Protein, ur: NEGATIVE mg/dL
Specific Gravity, Urine: 1.025 (ref 1.005–1.030)
pH: 5.5 (ref 5.0–8.0)

## 2021-11-03 LAB — URINALYSIS, MICROSCOPIC (REFLEX)

## 2021-11-03 LAB — LIPASE, BLOOD: Lipase: 24 U/L (ref 11–51)

## 2021-11-03 MED ORDER — FLUCONAZOLE 200 MG PO TABS: 200.0000 mg | ORAL_TABLET | Freq: Every day | ORAL | 1 refills | Status: AC

## 2021-11-03 MED ORDER — NITROFURANTOIN MONOHYD MACRO 100 MG PO CAPS: 100.0000 mg | ORAL_CAPSULE | Freq: Two times a day (BID) | ORAL | 0 refills | Status: DC

## 2021-11-03 MED ORDER — PHENAZOPYRIDINE HCL 200 MG PO TABS: 200.0000 mg | ORAL_TABLET | Freq: Three times a day (TID) | ORAL | 0 refills | Status: DC

## 2021-11-03 MED ORDER — METRONIDAZOLE 500 MG PO TABS: 500.0000 mg | ORAL_TABLET | Freq: Two times a day (BID) | ORAL | 0 refills | Status: DC

## 2021-11-03 NOTE — ED Triage Notes (Signed)
Patient c/o chest pain that started yesterday.  Patient denies SOB.  Patient reports vomiting yesterday.

## 2021-11-03 NOTE — Discharge Instructions (Addendum)
Your chest x-ray and EKG were very reassuring that this is not cardiac related chest pain.  The fact that you have some pain reproduced by palpation indicates that this is most likely musculoskeletal chest wall pain.  Take over-the-counter Tylenol and ibuprofen according to the package instructions as needed for this pain.  Your x-ray of your abdomen revealed that you are constipated and you have a stool collection in your left lower quadrant of your abdomen where you are having pain.  Use over-the-counter MiraLAX, 1 capful in 8 ounces of a beverage of your choice daily to relieve constipation.  Your urinalysis revealed the presence of urinary tract infection.  Take the Macrobid twice daily for 5 days with food for treatment of urinary tract infection.  Use the Pyridium every 8 hours as needed for urinary discomfort.  This will turn your urine a bright red-orange.  Increase your oral fluid intake so that you increase your urine production and or flushing your urinary system.  Take an over-the-counter probiotic, such as Culturelle-Align-Activia, 1 hour after each dose of antibiotic to prevent diarrhea or yeast infections from forming.  We will culture urine and change the antibiotics if necessary.  Your vaginal swab revealed the presence of both bacterial vaginosis and vaginal yeast infection.  Take the Flagyl (metronidazole) 500 mg twice daily for treatment of your bacterial vaginosis.  Avoid alcohol while on the metronidazole as taken together will cause of vomiting.  Bacterial vaginosis is often caused by a imbalance of bacteria in your vaginal vault.  This is sometimes a result of using tampons or hormonal fluctuations during her menstrual cycle.  You if your symptoms are recurrent you can try using a boric acid suppository twice weekly to help maintain the acid-base balance in your vagina vault which could prevent further infection.  You can also try vaginal probiotics to help  return normal bacterial balance.   Take 1 Diflucan tablet now and repeat in 7 days as needed for treatment of your yeast infection.

## 2021-11-03 NOTE — ED Provider Notes (Signed)
[facility]    CSN: [id number] Arrival date & time: 11/03/21  1230      History   Chief Complaint Chief Complaint  Patient presents with   Chest Pain    HPI Andrea Villarreal is a 31 y.o. female.   HPI  31 year old female here for evaluation of chest pain.  Patient reports that she developed chest pain yesterday in the left side of her chest.  This pain does increase with deep breathing.  She states she was eating a hot dog when the pain happened and this did induce an episode of vomiting.  She states that the pain will be in her left chest and will also radiate down to her left lower quadrant of her abdomen.  She is also having ear pain.  Patient is a smoker and has a strong odor of marijuana in the exam room.  She denies any shortness of breath, fever, runny nose nasal congestion, or diarrhea.  She also denies any pain with urination or urinary urgency or frequency though she does endorse that in the morning her urine has a strong odor.  She denies any vaginal discharge or itching.  History reviewed. No pertinent past medical history.  There are no problems to display for this patient.   Past Surgical History:  Procedure Laterality Date   TUBAL LIGATION      OB History   No obstetric history on file.      Home Medications    Prior to Admission medications   Medication Sig Start Date End Date Taking? Authorizing Provider  fluconazole (DIFLUCAN) 200 MG tablet Take 1 tablet (200 mg total) by mouth daily for 2 doses. 11/03/21 11/05/21 Yes Margarette Canada, NP  metroNIDAZOLE (FLAGYL) 500 MG tablet Take 1 tablet (500 mg total) by mouth 2 (two) times daily. 11/03/21  Yes Margarette Canada, NP  nitrofurantoin, macrocrystal-monohydrate, (MACROBID) 100 MG capsule Take 1 capsule (100 mg total) by mouth 2 (two) times daily. 11/03/21  Yes Margarette Canada, NP  phenazopyridine (PYRIDIUM) 200 MG tablet Take 1 tablet (200 mg total) by mouth 3 (three) times daily. 11/03/21  Yes Margarette Canada, NP  albuterol (VENTOLIN HFA) 108 (90 Base) MCG/ACT inhaler Inhale 2 puffs into the lungs every 6 (six) hours as needed for wheezing or shortness of breath. 05/16/21   Fransico Meadow, PA-C  cetirizine (ZYRTEC) 10 MG tablet Take 1 tablet (10 mg total) by mouth daily. 03/29/21   Cuthriell, Charline Bills, PA-C  diphenoxylate-atropine (LOMOTIL) 2.5-0.025 MG tablet Take 1 tablet by mouth 4 (four) times daily as needed for diarrhea or loose stools. 01/24/19   Cook, Barnie Del, DO  fluticasone (FLONASE) 50 MCG/ACT nasal spray Place 1 spray into both nostrils 2 (two) times daily. 03/29/21   Cuthriell, Charline Bills, PA-C  magic mouthwash w/lidocaine SOLN Take 5 mLs by mouth 4 (four) times daily. 03/29/21   Cuthriell, Charline Bills, PA-C  predniSONE (DELTASONE) 50 MG tablet One tablet a day 05/16/21   Fransico Meadow, PA-C    Family History History reviewed. No pertinent family history.  Social History Social History   Tobacco Use   Smoking status: Every Day    Packs/day: 0.50    Types: Cigarettes   Smokeless tobacco: Never  Vaping Use   Vaping Use: Former  Substance Use Topics   Alcohol use: No   Drug use: No     Allergies   Patient has no known allergies.   Review of Systems Review of Systems  Constitutional:  Negative for fever.  HENT:  Positive for ear pain. Negative for congestion, rhinorrhea and sore throat.   Respiratory:  Positive for cough. Negative for shortness of breath and wheezing.   Gastrointestinal:  Positive for abdominal pain, nausea and vomiting. Negative for diarrhea.  Genitourinary:  Negative for dysuria, frequency, urgency, vaginal discharge and vaginal pain.  Musculoskeletal:  Negative for back pain.  Skin:  Negative for rash.  Hematological: Negative.   Psychiatric/Behavioral: Negative.       Physical Exam Triage Vital Signs ED Triage Vitals  Enc Vitals Group     BP 11/03/21 1250 122/81     Pulse Rate 11/03/21 1250 79     Resp 11/03/21 1250 14     Temp  11/03/21 1250 98.7 F (37.1 C)     Temp Source 11/03/21 1250 Oral     SpO2 11/03/21 1250 97 %     Weight 11/03/21 1248 115 lb (52.2 kg)     Height 11/03/21 1248 5' (1.524 m)     Head Circumference --      Peak Flow --      Pain Score 11/03/21 1248 7     Pain Loc --      Pain Edu? --      Excl. in [place]? --    No data found.  Updated Vital Signs BP 122/81 (BP Location: Right Arm)   Pulse 79   Temp 98.7 F (37.1 C) (Oral)   Resp 14   Ht 5' (1.524 m)   Wt 115 lb (52.2 kg)   LMP 10/27/2021 (Approximate)   SpO2 97%   BMI 22.46 kg/m   Visual Acuity Right Eye Distance:   Left Eye Distance:   Bilateral Distance:    Right Eye Near:   Left Eye Near:    Bilateral Near:     Physical Exam Vitals and nursing note reviewed.  Constitutional:      Appearance: Normal appearance. She is not ill-appearing.  HENT:     Head: Normocephalic and atraumatic.     Right Ear: Tympanic membrane, ear canal and external ear normal. There is no impacted cerumen.     Left Ear: Tympanic membrane, ear canal and external ear normal. There is no impacted cerumen.     Nose: Nose normal. No congestion or rhinorrhea.     Mouth/Throat:     Mouth: Mucous membranes are moist.     Pharynx: Oropharynx is clear. No oropharyngeal exudate or posterior oropharyngeal erythema.  Cardiovascular:     Rate and Rhythm: Normal rate and regular rhythm.     Pulses: Normal pulses.     Heart sounds: Normal heart sounds. No murmur heard.    No friction rub. No gallop.  Pulmonary:     Effort: Pulmonary effort is normal.     Breath sounds: Normal breath sounds. No wheezing, rhonchi or rales.  Chest:     Chest wall: Tenderness present.  Abdominal:     General: Abdomen is flat.     Palpations: Abdomen is soft.     Tenderness: There is abdominal tenderness. There is no right CVA tenderness, left CVA tenderness, guarding or rebound.  Musculoskeletal:     Cervical back: Normal range of motion and neck supple.   Lymphadenopathy:     Cervical: No cervical adenopathy.  Skin:    General: Skin is warm and dry.     Capillary Refill: Capillary refill takes less than 2 seconds.     Findings: No erythema or  rash.  Neurological:     General: No focal deficit present.     Mental Status: She is alert and oriented to person, place, and time.  Psychiatric:        Mood and Affect: Mood normal.        Behavior: Behavior normal.        Thought Content: Thought content normal.        Judgment: Judgment normal.      UC Treatments / Results  Labs (all labs ordered are listed, but only abnormal results are displayed) Labs Reviewed  WET PREP, GENITAL - Abnormal; Notable for the following components:      Result Value   Yeast Wet Prep HPF POC PRESENT (*)    Clue Cells Wet Prep HPF POC PRESENT (*)    WBC, Wet Prep HPF POC <10 (*)    All other components within normal limits  COMPREHENSIVE METABOLIC PANEL - Abnormal; Notable for the following components:   Potassium 3.2 (*)    All other components within normal limits  URINALYSIS, ROUTINE W REFLEX MICROSCOPIC - Abnormal; Notable for the following components:   APPearance HAZY (*)    Hgb urine dipstick TRACE (*)    Ketones, ur 15 (*)    Nitrite POSITIVE (*)    All other components within normal limits  URINALYSIS, MICROSCOPIC (REFLEX) - Abnormal; Notable for the following components:   Bacteria, UA MANY (*)    All other components within normal limits  CBC WITH DIFFERENTIAL/PLATELET  LIPASE, BLOOD    EKG Normal sinus rhythm with a ventricular rate of 67 bpm Parable 144 ms QRS duration 86 ms QT/QTc 426/450 ms No ST or T wave abnormalities.   Radiology DG Abdomen 1 View  Result Date: 11/03/2021 CLINICAL DATA:  Left lower quadrant abdominal pain EXAM: ABDOMEN - 1 VIEW COMPARISON:  None Available. FINDINGS: Nonobstructive pattern of bowel gas with gas present to the rectum. Moderate burden of stool throughout the left and right colon. No free air  in the abdomen. No radio-opaque calculi or other significant radiographic abnormality are seen. IMPRESSION: Nonobstructive pattern of bowel gas with gas present to the rectum. Moderate burden of stool throughout the left and right colon. No free air in the abdomen. Electronically Signed   By: Delanna Ahmadi M.D.   On: 11/03/2021 13:43   DG Chest 2 View  Result Date: 11/03/2021 CLINICAL DATA:  Chest pain, cough EXAM: CHEST - 2 VIEW COMPARISON:  11/25/2018 FINDINGS: The heart size and mediastinal contours are within normal limits. Both lungs are clear. The visualized skeletal structures are unremarkable. IMPRESSION: No acute abnormality of the lungs. Electronically Signed   By: Delanna Ahmadi M.D.   On: 11/03/2021 13:41    Procedures Procedures (including critical care time)  Medications Ordered in UC Medications - No data to display  Initial Impression / Assessment and Plan / UC Course  I have reviewed the triage vital signs and the nursing notes.  Pertinent labs & imaging results that were available during my care of the patient were reviewed by me and considered in my medical decision making (see chart for details).  Patient is a nontoxic-appearing 31 year old female here for evaluation of multiple complaints as outlined in HPI above.  These complaints include left-sided chest pain that radiates above and below her breast and down to the left lower quadrant of her abdomen.  The pain does increase with deep breathing and is also reproducible with pushing on her chest.  She has  had a single episode of nausea and vomiting that occurred when her chest pain first developed.  The pain developed when she was eating a hot dog.  She does endorse that she has had a productive cough of brown sputum and she is a smoker.  She is also complaining of ear pain.  She denies fever, shortness of breath, runny nose, nasal congestion, or diarrhea.  Also denies urinary or vaginal symptoms.  Physical exam reveals  pearly-gray tympanic membranes bilaterally with normal light reflex and clear external auditory canals.  Nasal mucosa is pink and moist without erythema, edema, or discharge.  Oropharyngeal exam is benign.  No cervical lymphadenopathy appreciated on exam.  Cardiopulmonary exam reveals S1-S2 heart sounds with regular rate and rhythm and lung sounds that are clear auscultation all fields.  Patient does have reproducible chest pain with palpation of the left side of her anterior chest wall.  Abdomen is soft, flat, with mild diffuse tenderness.  She is more focally tender on her left lower quadrant.  No suprapubic or adnexal tenderness.  No CVA tenderness on exam.  EKG was obtained at triage and shows normal sinus rhythm without any T wave abnormalities or ST elevation.  I will check CBC, CMP, lipase, UA, wet prep, chest x-ray, and KUB.  Chest x-ray independent reviewed and evaluated by me.  Impression: Lung fields are well aerated and the costophrenic angles are crisp.  Mediastinal contours and heart size appears normal.  No evidence of effusion or infiltrate.  Radiology overread is pending. Radiology impression states heart size and mediastinal contours are within normal limits and both lungs are clear.  No acute abnormality of the lungs.  KUB independently reviewed and evaluated by me.  Impression: There are nonspecific air-fluid levels.  Patient does have a collection of stool in the distal colon and rectal vault.  Radiology overread is pending. Radiology impression states there is a nonobstructive pattern of bowel gas with gas present to the rectum.  Moderate burden of stool throughout the left and right colon.  No free air in the abdomen.  CBC is unremarkable.  Urinalysis shows hazy appearance, trace hemoglobin, 15 ketones, and nitrite positive.  Negative for leukocyte esterase or protein.  Reflex micro shows many bacteria but 6-10 WBCs cells and 6-10 squamous epithelials.  Vaginal wet prep is positive  for yeast and clue cells.  CMP shows a mildly decreased potassium of 3.2 but remainder of electrolytes, renal function, and transaminases are all normal.  Lipase is 24 and normal.  I will discharge patient with a diagnosis of urinary tract infection and placed her on Macrobid twice daily for 5 days.  I will also diagnosed her with bacterial vaginosis and a vaginal yeast infection which I will treat with metronidazole and Diflucan respectively.  Her constipation can be treated with over-the-counter MiraLAX.   Final Clinical Impressions(s) / UC Diagnoses   Final diagnoses:  BV (bacterial vaginosis)  Vaginal yeast infection  Nonspecific chest pain  Acute cystitis without hematuria  Constipation, unspecified constipation type     Discharge Instructions      Your chest x-ray and EKG were very reassuring that this is not cardiac related chest pain.  The fact that you have some pain reproduced by palpation indicates that this is most likely musculoskeletal chest wall pain.  Take over-the-counter Tylenol and ibuprofen according to the package instructions as needed for this pain.  Your x-ray of your abdomen revealed that you are constipated and you have a stool collection  in your left lower quadrant of your abdomen where you are having pain.  Use over-the-counter MiraLAX, 1 capful in 8 ounces of a beverage of your choice daily to relieve constipation.  Your urinalysis revealed the presence of urinary tract infection.  Take the Macrobid twice daily for 5 days with food for treatment of urinary tract infection.  Use the Pyridium every 8 hours as needed for urinary discomfort.  This will turn your urine a bright red-orange.  Increase your oral fluid intake so that you increase your urine production and or flushing your urinary system.  Take an over-the-counter probiotic, such as Culturelle-Align-Activia, 1 hour after each dose of antibiotic to prevent diarrhea or yeast infections from  forming.  We will culture urine and change the antibiotics if necessary.  Your vaginal swab revealed the presence of both bacterial vaginosis and vaginal yeast infection.  Take the Flagyl (metronidazole) 500 mg twice daily for treatment of your bacterial vaginosis.  Avoid alcohol while on the metronidazole as taken together will cause of vomiting.  Bacterial vaginosis is often caused by a imbalance of bacteria in your vaginal vault.  This is sometimes a result of using tampons or hormonal fluctuations during her menstrual cycle.  You if your symptoms are recurrent you can try using a boric acid suppository twice weekly to help maintain the acid-base balance in your vagina vault which could prevent further infection.  You can also try vaginal probiotics to help return normal bacterial balance.   Take 1 Diflucan tablet now and repeat in 7 days as needed for treatment of your yeast infection.     ED Prescriptions     Medication Sig Dispense Auth. Provider   nitrofurantoin, macrocrystal-monohydrate, (MACROBID) 100 MG capsule Take 1 capsule (100 mg total) by mouth 2 (two) times daily. 10 capsule Margarette Canada, NP   phenazopyridine (PYRIDIUM) 200 MG tablet Take 1 tablet (200 mg total) by mouth 3 (three) times daily. 6 tablet Margarette Canada, NP   metroNIDAZOLE (FLAGYL) 500 MG tablet Take 1 tablet (500 mg total) by mouth 2 (two) times daily. 14 tablet Margarette Canada, NP   fluconazole (DIFLUCAN) 200 MG tablet Take 1 tablet (200 mg total) by mouth daily for 2 doses. 2 tablet Margarette Canada, NP      PDMP not reviewed this encounter.   Margarette Canada, NP 11/03/21 1436

## 2021-11-05 LAB — URINE CULTURE: Culture: >=100000 — AB

## 2022-05-01 ENCOUNTER — Ambulatory Visit
Admission: EM | Admit: 2022-05-01 | Discharge: 2022-05-01 | Disposition: A | Discharge status: Home / Self Care | Payer: Medicaid Other

## 2022-05-02 ENCOUNTER — Ambulatory Visit
Admission: EM | Admit: 2022-05-02 | Discharge: 2022-05-02 | Disposition: A | Discharge status: Home / Self Care | Payer: Medicaid Other | Attending: Family Medicine | Admitting: Family Medicine

## 2022-05-02 DIAGNOSIS — J101 Influenza due to other identified influenza virus with other respiratory manifestations: Secondary | ICD-10-CM

## 2022-05-02 DIAGNOSIS — Z1152 Encounter for screening for COVID-19: Secondary | ICD-10-CM | POA: Diagnosis not present

## 2022-05-02 LAB — RESP PANEL BY RT-PCR (RSV, FLU A&B, COVID)  RVPGX2
Influenza A by PCR: POSITIVE — AB
Influenza B by PCR: NEGATIVE
Resp Syncytial Virus by PCR: NEGATIVE
SARS Coronavirus 2 by RT PCR: NEGATIVE

## 2022-05-02 LAB — GROUP A STREP BY PCR: Group A Strep by PCR: NOT DETECTED

## 2022-05-02 MED ORDER — IPRATROPIUM BROMIDE 0.06 % NA SOLN: 2.0000 | Freq: Four times a day (QID) | NASAL | 12 refills | Status: DC

## 2022-05-02 NOTE — ED Provider Notes (Signed)
[facility]    CSN: [id number] Arrival date & time: 05/02/22  1640      History   Chief Complaint Chief Complaint  Patient presents with   Chills    \    Fever   Generalized Body Aches   Sore Throat    HPI Andrea Villarreal is a 32 y.o. female.   HPI   Andrea Villarreal presents for chills, body aches, fever, sore throat and headache that started 3 days ago.  States she with her ex-boyfriend another location.  She flew back.  The next couple days she started feeling ill.  She took some OTC medications without relief.  No vomiting, diarrhea, chest discomfort or abdominal pain.  She has history of emphysema and has intermittent shortness of breath. No known sick contacts.     History reviewed. No pertinent past medical history.  There are no problems to display for this patient.   Past Surgical History:  Procedure Laterality Date   TUBAL LIGATION      OB History   No obstetric history on file.      Home Medications    Prior to Admission medications   Medication Sig Start Date End Date Taking? Authorizing Provider  albuterol (VENTOLIN HFA) 108 (90 Base) MCG/ACT inhaler Inhale 2 puffs into the lungs every 6 (six) hours as needed for wheezing or shortness of breath. 05/16/21  Yes Caryl Ada K, PA-C  cetirizine (ZYRTEC) 10 MG tablet Take 1 tablet (10 mg total) by mouth daily. 03/29/21  Yes Cuthriell, Charline Bills, PA-C  diphenoxylate-atropine (LOMOTIL) 2.5-0.025 MG tablet Take 1 tablet by mouth 4 (four) times daily as needed for diarrhea or loose stools. 01/24/19  Yes Cook, Jayce G, DO  fluticasone (FLONASE) 50 MCG/ACT nasal spray Place 1 spray into both nostrils 2 (two) times daily. 03/29/21  Yes Cuthriell, Charline Bills, PA-C  ipratropium (ATROVENT) 0.06 % nasal spray Place 2 sprays into both nostrils 4 (four) times daily. 05/02/22  Yes Joci Dress, Ronnette Juniper, DO  magic mouthwash w/lidocaine SOLN Take 5 mLs by mouth 4 (four) times daily. 03/29/21  Yes Cuthriell,  Charline Bills, PA-C  metroNIDAZOLE (FLAGYL) 500 MG tablet Take 1 tablet (500 mg total) by mouth 2 (two) times daily. 11/03/21  Yes Margarette Canada, NP  nitrofurantoin, macrocrystal-monohydrate, (MACROBID) 100 MG capsule Take 1 capsule (100 mg total) by mouth 2 (two) times daily. 11/03/21  Yes Margarette Canada, NP  phenazopyridine (PYRIDIUM) 200 MG tablet Take 1 tablet (200 mg total) by mouth 3 (three) times daily. 11/03/21  Yes Margarette Canada, NP  predniSONE (DELTASONE) 50 MG tablet One tablet a day 05/16/21  Yes Fransico Meadow, [place]    Family History History reviewed. No pertinent family history.  Social History Social History   Tobacco Use   Smoking status: Every Day    Packs/day: 0.50    Types: Cigarettes   Smokeless tobacco: Never  Vaping Use   Vaping Use: Former  Substance Use Topics   Alcohol use: No   Drug use: No     Allergies   Patient has no known allergies.   Review of Systems Review of Systems: negative unless otherwise stated in HPI.      Physical Exam Triage Vital Signs ED Triage Vitals  Enc Vitals Group     BP 05/02/22 1657 113/73     Pulse Rate 05/02/22 1657 80     Resp 05/02/22 1657 16     Temp 05/02/22 1657 98.8 F (37.1 C)  Temp Source 05/02/22 1657 Oral     SpO2 05/02/22 1657 96 %     Weight 05/02/22 1656 105 lb (47.6 kg)     Height 05/02/22 1656 5' (1.524 m)     Head Circumference --      Peak Flow --      Pain Score 05/02/22 1656 8     Pain Loc --      Pain Edu? --      Excl. in [place]? --    No data found.  Updated Vital Signs BP 113/73 (BP Location: Left Arm)   Pulse 80   Temp 98.8 F (37.1 C) (Oral)   Resp 16   Ht 5' (1.524 m)   Wt 47.6 kg   LMP 04/26/2022   SpO2 96%   BMI 20.51 kg/m   Visual Acuity Right Eye Distance:   Left Eye Distance:   Bilateral Distance:    Right Eye Near:   Left Eye Near:    Bilateral Near:     Physical Exam GEN:     alert, non-toxic appearing female in no distress    HENT:  mucus membranes moist,  oropharyngeal without lesions or exudate, no tonsillar hypertrophy, mild oropharyngeal erythema, moderate erythematous edematous turbinates, clear nasal discharge, bilateral TM normal EYES:   pupils equal and reactive, no scleral injection or discharge NECK:  normal ROM  RESP:  no increased work of breathing, mild expiratory wheezing, no rales, no rhonchi CVS:   regular rate and rhythm Skin:   warm and dry, no rash on visible skin    UC Treatments / Results  Labs (all labs ordered are listed, but only abnormal results are displayed) Labs Reviewed  RESP PANEL BY RT-PCR (RSV, FLU A&B, COVID)  RVPGX2 - Abnormal; Notable for the following components:      Result Value   Influenza A by PCR POSITIVE (*)    All other components within normal limits  GROUP A STREP BY PCR    EKG   Radiology No results found.  Procedures Procedures (including critical care time)  Medications Ordered in UC Medications - No data to display  Initial Impression / Assessment and Plan / UC Course  I have reviewed the triage vital signs and the nursing notes.  Pertinent labs & imaging results that were available during my care of the patient were reviewed by me and considered in my medical decision making (see chart for details).       Pt is a 32 y.o. female who presents for 3 days of respiratory symptoms. Christiona is afebrile here without recent antipyretics. Satting well on room air. Overall pt is non-toxic appearing, well hydrated, without respiratory distress. Pulmonary exam is remarkable mild expiratory wheezing.  COVID and influenza testing obtained. She is influenza A positive.  Discussed symptomatic treatment.  Explained lack of efficacy of antibiotics in viral disease.  She is outside the window for treatment with Tamiflu. Atrovent nasal spray sent to preferred pharmacy.Typical duration of symptoms discussed.   Return and ED precautions given and voiced understanding. Discussed MDM, treatment plan  and plan for follow-up with patient who agrees with plan.     Final Clinical Impressions(s) / UC Diagnoses   Final diagnoses:  Influenza A     Discharge Instructions      Your influenza A test is positive.  You have the flu.  Your COVID and RSV test were negative.If your were prescribed medication, stop by the pharmacy to pick them up. You can  take Tylenol and/or Ibuprofen as needed for fever reduction and pain relief.    For cough: honey 1/2 to 1 teaspoon (you can dilute the honey in water or another fluid).  You can also use guaifenesin and dextromethorphan for cough. You can use a humidifier for chest congestion and cough.  If you don't have a humidifier, you can sit in the bathroom with the hot shower running.      For sore throat: try warm salt water gargles, Mucinex sore throat cough drops or cepacol lozenges, throat spray, warm tea or water with lemon/honey, popsicles or ice, or OTC cold relief medicine for throat discomfort. You can also purchase chloraseptic spray at the pharmacy or dollar store.   For congestion: take a daily anti-histamine like Zyrtec, Claritin, and a oral decongestant, such as pseudoephedrine.  You can also use Flonase 1-2 sprays in each nostril daily. Afrin is also a good option, if you do not have high blood pressure.    It is important to stay hydrated: drink plenty of fluids (water, gatorade/powerade/pedialyte, juices, or teas) to keep your throat moisturized and help further relieve irritation/discomfort.    Return or go to the Emergency Department if symptoms worsen or do not improve in the next few days     ED Prescriptions     Medication Sig Dispense Auth. Provider   ipratropium (ATROVENT) 0.06 % nasal spray Place 2 sprays into both nostrils 4 (four) times daily. 15 mL Lyndee Hensen, DO      PDMP not reviewed this encounter.   Lyndee Hensen, DO 05/02/22 1807

## 2022-05-02 NOTE — Discharge Instructions (Signed)
Your influenza A test is positive.  You have the flu.  Your COVID and RSV test were negative.If your were prescribed medication, stop by the pharmacy to pick them up. You can take Tylenol and/or Ibuprofen as needed for fever reduction and pain relief.    For cough: honey 1/2 to 1 teaspoon (you can dilute the honey in water or another fluid).  You can also use guaifenesin and dextromethorphan for cough. You can use a humidifier for chest congestion and cough.  If you don't have a humidifier, you can sit in the bathroom with the hot shower running.      For sore throat: try warm salt water gargles, Mucinex sore throat cough drops or cepacol lozenges, throat spray, warm tea or water with lemon/honey, popsicles or ice, or OTC cold relief medicine for throat discomfort. You can also purchase chloraseptic spray at the pharmacy or dollar store.   For congestion: take a daily anti-histamine like Zyrtec, Claritin, and a oral decongestant, such as pseudoephedrine.  You can also use Flonase 1-2 sprays in each nostril daily. Afrin is also a good option, if you do not have high blood pressure.    It is important to stay hydrated: drink plenty of fluids (water, gatorade/powerade/pedialyte, juices, or teas) to keep your throat moisturized and help further relieve irritation/discomfort.    Return or go to the Emergency Department if symptoms worsen or do not improve in the next few days

## 2022-05-02 NOTE — ED Triage Notes (Signed)
Pt c/o chills,fever,bodyaches & sore throat x3 days.

## 2022-08-08 DIAGNOSIS — R109 Unspecified abdominal pain: Secondary | ICD-10-CM | POA: Insufficient documentation

## 2022-08-08 DIAGNOSIS — D72829 Elevated white blood cell count, unspecified: Secondary | ICD-10-CM | POA: Insufficient documentation

## 2022-11-11 ENCOUNTER — Ambulatory Visit
Admission: EM | Admit: 2022-11-11 | Discharge: 2022-11-11 | Disposition: A | Discharge status: Home / Self Care | Payer: BLUE CROSS/BLUE SHIELD | Attending: Emergency Medicine | Admitting: Emergency Medicine

## 2022-11-11 ENCOUNTER — Encounter: Payer: Self-pay | Admitting: Emergency Medicine

## 2022-11-11 DIAGNOSIS — L578 Other skin changes due to chronic exposure to nonionizing radiation: Secondary | ICD-10-CM | POA: Diagnosis not present

## 2022-11-11 DIAGNOSIS — L550 Sunburn of first degree: Secondary | ICD-10-CM | POA: Diagnosis not present

## 2022-11-11 MED ORDER — NAPROXEN 500 MG PO TABS: 500.0000 mg | ORAL_TABLET | Freq: Two times a day (BID) | ORAL | 0 refills | Status: DC

## 2022-11-11 MED ORDER — PREDNISONE 10 MG (21) PO TBPK: ORAL_TABLET | ORAL | 0 refills | Status: DC

## 2022-11-11 NOTE — ED Provider Notes (Signed)
HPI  SUBJECTIVE:  Andrea Villarreal is a 32 y.o. female who presents with a diffuse sunburn over her torso and extremities after being at the beach 2 days ago.  Reports a sensation of her legs and feet swelling, sharp, shooting bilateral leg pain below her knees, body aches.  No fevers, blisters.  Reports difficulty walking secondary to the pain.  No unilateral calf swelling.  She tried aloe spray, lidocaine spray and Tylenol.  Her leg pain is better with sitting, worse with standing and walking.  No antipyretic in the past 6 hours.  She has a past medical history of asthma/emphysema.  LMP: Friday.  Denies the possibility of being pregnant.  [facility]   History reviewed. No pertinent past medical history.  Past Surgical History:  Procedure Laterality Date   TUBAL LIGATION      History reviewed. No pertinent family history.  Social History   Tobacco Use   Smoking status: Every Day    Current packs/day: 0.50    Types: Cigarettes   Smokeless tobacco: Never  Vaping Use   Vaping status: Former  Substance Use Topics   Alcohol use: No   Drug use: No    No current facility-administered medications for this encounter.  Current Outpatient Medications:    naproxen (NAPROSYN) 500 MG tablet, Take 1 tablet (500 mg total) by mouth 2 (two) times daily., Disp: 20 tablet, Rfl: 0   predniSONE (STERAPRED UNI-PAK 21 TAB) 10 MG (21) TBPK tablet, Dispense one 6 day pack. Take as directed with food., Disp: 21 tablet, Rfl: 0   albuterol (VENTOLIN HFA) 108 (90 Base) MCG/ACT inhaler, Inhale 2 puffs into the lungs every 6 (six) hours as needed for wheezing or shortness of breath., Disp: 8 g, Rfl: 2   cetirizine (ZYRTEC) 10 MG tablet, Take 1 tablet (10 mg total) by mouth daily., Disp: 30 tablet, Rfl: 0   diphenoxylate-atropine (LOMOTIL) 2.5-0.025 MG tablet, Take 1 tablet by mouth 4 (four) times daily as needed for diarrhea or loose stools., Disp: 30 tablet, Rfl: 0   fluticasone (FLONASE) 50  MCG/ACT nasal spray, Place 1 spray into both nostrils 2 (two) times daily., Disp: 16 g, Rfl: 0   ipratropium (ATROVENT) 0.06 % nasal spray, Place 2 sprays into both nostrils 4 (four) times daily., Disp: 15 mL, Rfl: 12  No Known Allergies   ROS  As noted in HPI.   Physical Exam  BP 116/72 (BP Location: Right Arm)   Pulse 74   Temp 98.7 F (37.1 C) (Oral)   Resp 16   LMP 11/07/2022   SpO2 97%   Constitutional: Well developed, well nourished, no acute distress Eyes:  EOMI, conjunctiva normal bilaterally HENT: Normocephalic, atraumatic,mucus membranes moist Respiratory: Normal inspiratory effort Cardiovascular: Normal rate GI: nondistended skin: Nontender blanchable erythema consistent with first-degree sunburn on shoulders, chest, abdomen, upper thighs.  No blisters Musculoskeletal: Tender, blanchable erythema with mild bilateral swelling along the ankle and foot below the knees.  Trace edema.  No blisters.  Calves symmetric, nontender.  No palpable cord. Neurologic: Alert & oriented x 3, no focal neuro deficits Psychiatric: Speech and behavior appropriate   ED Course   Medications - No data to display  No orders of the defined types were placed in this encounter.   No results found for this or any previous visit (from the past 24 hour(s)). No results found.  ED Clinical Impression  1. Sunburn of first degree   2. Dermatitis due to sunburn  ED Assessment/Plan     Patient presents with a large first-degree sunburn.  I suspect that she has a dermatitis on her lower extremities.  Doubt bilateral DVT.  Will send home with a prednisone 6-day taper, Naprosyn/Tylenol twice a day, advised raw aloe, push electrolyte containing fluids.  Work note for today and tomorrow.  Discussed MDM, treatment plan, and plan for follow-up with patient.  patient agrees with plan.   Meds ordered this encounter  Medications   predniSONE (STERAPRED UNI-PAK 21 TAB) 10 MG (21) TBPK  tablet    Sig: Dispense one 6 day pack. Take as directed with food.    Dispense:  21 tablet    Refill:  0   naproxen (NAPROSYN) 500 MG tablet    Sig: Take 1 tablet (500 mg total) by mouth 2 (two) times daily.    Dispense:  20 tablet    Refill:  0      *This clinic note was created using Scientist, clinical (histocompatibility and immunogenetics). Therefore, there may be occasional mistakes despite careful proofreading.  ?    Domenick Gong, MD 11/11/22 1427

## 2022-11-11 NOTE — ED Triage Notes (Addendum)
Pt presents with sunburn on bilateral legs x 2 days. Pt has tried Aloe with no relief.

## 2022-11-11 NOTE — Discharge Instructions (Signed)
Take the Naprosyn with 1000 mg of Tylenol twice a day as needed for pain and swelling.  Finish the prednisone, even if you feel better.  Raw aloe as we discussed.  Push electrolyte containing fluids such as Pedialyte, liquid IV or Gatorade until your urine is clear.

## 2022-11-16 ENCOUNTER — Encounter: Payer: Self-pay | Admitting: Emergency Medicine

## 2022-11-16 ENCOUNTER — Ambulatory Visit
Admission: EM | Admit: 2022-11-16 | Discharge: 2022-11-16 | Disposition: A | Discharge status: Home / Self Care | Payer: BLUE CROSS/BLUE SHIELD

## 2022-11-16 DIAGNOSIS — F17200 Nicotine dependence, unspecified, uncomplicated: Secondary | ICD-10-CM

## 2022-11-16 DIAGNOSIS — T380X5A Adverse effect of glucocorticoids and synthetic analogues, initial encounter: Secondary | ICD-10-CM

## 2022-11-16 DIAGNOSIS — R609 Edema, unspecified: Secondary | ICD-10-CM | POA: Diagnosis not present

## 2022-11-16 NOTE — Discharge Instructions (Addendum)
Prednisone can cause swelling of hands,feet,face, mood swings,insomnia, bruising, palpitations(fast heart beat),etc.  Make sure to drink water,avoid excessive salt,avid caffeine  Follow up with PCP if no improvement next week, go to ER for worsening symptoms or concerns.

## 2022-11-16 NOTE — ED Provider Notes (Signed)
[facility]    CSN: [id number] Arrival date & time: 11/16/22  1425      History   Chief Complaint Chief Complaint  Patient presents with   Facial Swelling   Leg Swelling    HPI Andrea Villarreal is a 32 y.o. female.   Andrea Villarreal, 32 year old female pt, presents to urgent care for evaluation of swelling to hands,feet face. Pt states she was seen here on 11/11/22 for sunburn and placed on prednisone. Pt denies any additional trauma or injury.   Pt endorses smoking  The history is provided by the patient. No language interpreter was used.    History reviewed. No pertinent past medical history.  Patient Active Problem List   Diagnosis Date Noted   Prednisone adverse reaction 11/16/2022   Swelling 11/16/2022    Past Surgical History:  Procedure Laterality Date   TUBAL LIGATION      OB History   No obstetric history on file.      Home Medications    Prior to Admission medications   Medication Sig Start Date End Date Taking? Authorizing Provider  albuterol (VENTOLIN HFA) 108 (90 Base) MCG/ACT inhaler Inhale 2 puffs into the lungs every 6 (six) hours as needed for wheezing or shortness of breath. 05/16/21   Elson Areas, PA-C  cetirizine (ZYRTEC) 10 MG tablet Take 1 tablet (10 mg total) by mouth daily. 03/29/21   Cuthriell, Delorise Royals, PA-C  diphenoxylate-atropine (LOMOTIL) 2.5-0.025 MG tablet Take 1 tablet by mouth 4 (four) times daily as needed for diarrhea or loose stools. 01/24/19   Cook, Verdis Frederickson, DO  fluticasone (FLONASE) 50 MCG/ACT nasal spray Place 1 spray into both nostrils 2 (two) times daily. 03/29/21   Cuthriell, Delorise Royals, PA-C  ipratropium (ATROVENT) 0.06 % nasal spray Place 2 sprays into both nostrils 4 (four) times daily. 05/02/22   Brimage, Seward Meth, DO  naproxen (NAPROSYN) 500 MG tablet Take 1 tablet (500 mg total) by mouth 2 (two) times daily. 11/11/22   Domenick Gong, MD  predniSONE (STERAPRED UNI-PAK 21 TAB) 10 MG (21) TBPK  tablet Dispense one 6 day pack. Take as directed with food. 11/11/22   Domenick Gong, MD    Family History History reviewed. No pertinent family history.  Social History Social History   Tobacco Use   Smoking status: Every Day    Current packs/day: 0.50    Types: Cigarettes   Smokeless tobacco: Never  Vaping Use   Vaping status: Former  Substance Use Topics   Alcohol use: No   Drug use: No     Allergies   Patient has no known allergies.   Review of Systems Review of Systems  Skin:        swelling  Psychiatric/Behavioral:  The patient is nervous/anxious.   All other systems reviewed and are negative.    Physical Exam Triage Vital Signs ED Triage Vitals  Encounter Vitals Group     BP      Systolic BP Percentile      Diastolic BP Percentile      Pulse      Resp      Temp      Temp src      SpO2      Weight      Height      Head Circumference      Peak Flow      Pain Score      Pain Loc      Pain  Education      Exclude from Growth Chart    No data found.  Updated Vital Signs BP (!) 112/96 (BP Location: Left Arm)   Pulse (!) 101   Temp 98.4 F (36.9 C) (Oral)   Resp 14   Ht 5' (1.524 m)   Wt 104 lb 15 oz (47.6 kg)   LMP 11/07/2022   SpO2 96%   BMI 20.49 kg/m   Visual Acuity Right Eye Distance:   Left Eye Distance:   Bilateral Distance:    Right Eye Near:   Left Eye Near:    Bilateral Near:     Physical Exam Vitals and nursing note reviewed.  Constitutional:      Appearance: Normal appearance. She is well-developed and well-groomed.  HENT:     Head: Normocephalic.     Comments: Mild puffiness/swelling to face no SQ emphysema or pitting Cardiovascular:     Rate and Rhythm: Regular rhythm. Tachycardia present.     Pulses: Normal pulses.          Radial pulses are 2+ on the right side and 2+ on the left side.       Dorsalis pedis pulses are 2+ on the right side and 2+ on the left side.     Heart sounds: Normal heart sounds.   Pulmonary:     Effort: Pulmonary effort is normal.     Breath sounds: Normal breath sounds and air entry.  Skin:    General: Skin is warm.     Capillary Refill: Capillary refill takes less than 2 seconds.     Comments: Pt has mild swelling noted to feet, no pitting edema, bruising to left side of neck(hickey type appearance)  Neurological:     General: No focal deficit present.     Mental Status: She is alert and oriented to person, place, and time.     GCS: GCS eye subscore is 4. GCS verbal subscore is 5. GCS motor subscore is 6.  Psychiatric:        Attention and Perception: Attention normal.        Mood and Affect: Mood is anxious.        Speech: Speech normal.        Behavior: Behavior is cooperative.      UC Treatments / Results  Labs (all labs ordered are listed, but only abnormal results are displayed) Labs Reviewed - No data to display  EKG   Radiology No results found.  Procedures Procedures (including critical care time)  Medications Ordered in UC Medications - No data to display  Initial Impression / Assessment and Plan / UC Course  I have reviewed the triage vital signs and the nursing notes.  Pertinent labs & imaging results that were available during my care of the patient were reviewed by me and considered in my medical decision making (see chart for details).    Discussed with pt,reassured that most likely her symptoms were coming from prednisone, should resolve since she completed prednisone, if she continues to have swelling or worsening symptoms follow up with PCP for labs/recheck or go to ER. Pt verbalized understanding to this provider.   Ddx: Swelling side effect from prednisone, smoker, bruising Final Clinical Impressions(s) / UC Diagnoses   Final diagnoses:  Adverse effect of prednisone, initial encounter  Swelling  Smoker     Discharge Instructions      Prednisone can cause swelling of hands,feet,face, mood swings,insomnia,  bruising, palpitations(fast heart beat),etc.  Make sure to  drink water,avoid excessive salt,avid caffeine  Follow up with PCP if no improvement next week, go to ER for worsening symptoms or concerns.      ED Prescriptions   None    PDMP not reviewed this encounter.   Clancy Gourd, NP 11/16/22 [phone]

## 2022-11-16 NOTE — ED Triage Notes (Signed)
Patient was seen here on 11/11/22 for sunburn and swelling in her feet.  Patient took her last Prednisone pill this morning.  Patient reports swelling in her face and hands today.  Patient still reports swelling in her feet and ankles.

## 2022-11-17 DIAGNOSIS — R21 Rash and other nonspecific skin eruption: Secondary | ICD-10-CM | POA: Insufficient documentation

## 2023-01-12 ENCOUNTER — Ambulatory Visit: Payer: Self-pay

## 2023-01-28 DIAGNOSIS — H6990 Unspecified Eustachian tube disorder, unspecified ear: Secondary | ICD-10-CM | POA: Insufficient documentation

## 2023-05-09 ENCOUNTER — Other Ambulatory Visit: Payer: Self-pay

## 2023-05-09 ENCOUNTER — Emergency Department
Admission: EM | Admit: 2023-05-09 | Discharge: 2023-05-09 | Discharge status: Against Medical Advice | Payer: BLUE CROSS/BLUE SHIELD | Attending: Emergency Medicine | Admitting: Emergency Medicine

## 2023-05-09 DIAGNOSIS — Z5321 Procedure and treatment not carried out due to patient leaving prior to being seen by health care provider: Secondary | ICD-10-CM | POA: Insufficient documentation

## 2023-05-09 DIAGNOSIS — R1110 Vomiting, unspecified: Secondary | ICD-10-CM | POA: Diagnosis present

## 2023-05-09 DIAGNOSIS — R109 Unspecified abdominal pain: Secondary | ICD-10-CM | POA: Insufficient documentation

## 2023-05-09 DIAGNOSIS — R197 Diarrhea, unspecified: Secondary | ICD-10-CM | POA: Insufficient documentation

## 2023-05-09 LAB — CBC
HCT: 42.8 % (ref 36.0–46.0)
Hemoglobin: 14.6 g/dL (ref 12.0–15.0)
MCH: 28.9 pg (ref 26.0–34.0)
MCHC: 34.1 g/dL (ref 30.0–36.0)
MCV: 84.8 fL (ref 80.0–100.0)
Platelets: 303 10*3/uL (ref 150–400)
RBC: 5.05 MIL/uL (ref 3.87–5.11)
RDW: 14.3 % (ref 11.5–15.5)
WBC: 23.4 10*3/uL — ABNORMAL HIGH (ref 4.0–10.5)
nRBC: 0 % (ref 0.0–0.2)

## 2023-05-09 LAB — COMPREHENSIVE METABOLIC PANEL
ALT: 15 U/L (ref 0–44)
AST: 20 U/L (ref 15–41)
Albumin: 4.5 g/dL (ref 3.5–5.0)
Alkaline Phosphatase: 67 U/L (ref 38–126)
Anion gap: 12 (ref 5–15)
BUN: 12 mg/dL (ref 6–20)
CO2: 22 mmol/L (ref 22–32)
Calcium: 9.4 mg/dL (ref 8.9–10.3)
Chloride: 105 mmol/L (ref 98–111)
Creatinine, Ser: 0.76 mg/dL (ref 0.44–1.00)
GFR, Estimated: >60 mL/min (ref >60)
Glucose, Bld: 153 mg/dL — ABNORMAL HIGH (ref 70–99)
Potassium: 3.7 mmol/L (ref 3.5–5.1)
Sodium: 139 mmol/L (ref 135–145)
Total Bilirubin: 0.7 mg/dL (ref 0.0–1.2)
Total Protein: 7.8 g/dL (ref 6.5–8.1)

## 2023-05-09 LAB — LIPASE, BLOOD: Lipase: 24 U/L (ref 11–51)

## 2023-05-09 MED ORDER — ONDANSETRON 4 MG PO TBDP
4.0000 mg | ORAL_TABLET | Freq: Once | ORAL | Status: AC
Start: 2023-05-09 — End: 2023-05-09
  Administered 2023-05-09: 4 mg via ORAL
  Filled 2023-05-09: qty 1

## 2023-05-09 NOTE — ED Notes (Signed)
Pt called for treatment room x1

## 2023-05-09 NOTE — ED Triage Notes (Signed)
Patient C/O abdominal pain, vomiting and diarrhea that began around 2200 last night.

## 2023-05-11 ENCOUNTER — Telehealth: Payer: Self-pay | Admitting: Emergency Medicine

## 2023-05-11 NOTE — Telephone Encounter (Signed)
Called patient due to left emergency department before provider exam to inquire about condition and follow up plans. Says she is feeling better.  I explained that wbc was elevated and recommend she still address this with pcp.  She will call them now.

## 2023-06-22 ENCOUNTER — Ambulatory Visit
Admission: RE | Admit: 2023-06-22 | Discharge: 2023-06-22 | Disposition: A | Discharge status: Home / Self Care | Payer: Self-pay | Source: Ambulatory Visit

## 2023-06-22 VITALS — BP 119/62 | HR 65 | Temp 98.8°F | Resp 16 | Ht 62.0 in | Wt 115.0 lb

## 2023-06-22 DIAGNOSIS — M25512 Pain in left shoulder: Secondary | ICD-10-CM

## 2023-06-22 MED ORDER — BACLOFEN 10 MG PO TABS: 10.0000 mg | ORAL_TABLET | Freq: Three times a day (TID) | ORAL | 0 refills | Status: AC

## 2023-06-22 MED ORDER — KETOROLAC TROMETHAMINE 30 MG/ML IJ SOLN
30.0000 mg | Freq: Once | INTRAMUSCULAR | Status: AC
  Administered 2023-06-22: 30 mg via INTRAMUSCULAR

## 2023-06-22 MED ORDER — IBUPROFEN 600 MG PO TABS: 600.0000 mg | ORAL_TABLET | Freq: Four times a day (QID) | ORAL | 0 refills | Status: DC | PRN

## 2023-06-22 MED ORDER — DIAZEPAM 5 MG PO TABS: 5.0000 mg | ORAL_TABLET | Freq: Every evening | ORAL | 0 refills | Status: AC

## 2023-06-22 NOTE — Discharge Instructions (Addendum)
 Take the ibuprofen, 600 mg every 6 hours with food, on a schedule for the next 48 hours and then as needed.  Take the baclofen, 10 mg every 8 hours, on a schedule for the next 48 hours and then as needed.  Apply moist heat to your neck for 30 minutes at a time 2-3 times a day to improve blood flow to the area and help remove the lactic acid causing the spasm.  Follow the neck exercises given at discharge.  Take the diazepam 5 mg at bedtime to help relax the muscles in your neck and aid in sleep.  Return for reevaluation for any new or worsening symptoms.

## 2023-06-22 NOTE — ED Provider Notes (Signed)
 [facility]    CSN: [id number] Arrival date & time: 06/22/23  1030      History   Chief Complaint Chief Complaint  Patient presents with   Arm Injury    Appt    HPI Andrea Villarreal is a 33 y.o. female.   HPI  33 year old female with past medical history significant for tobacco and marijuana use and tubal ligation presents for evaluation of pain in her left shoulder girdle that started a week ago.  She reports that she thinks she slept wrong because she does not remember any heavy lifting or injury.  She denies any falls.  She reports that it is impacting her ability to sleep and it hurts if she turns her neck or tries to lift her left shoulder.  No numbness or tingling.  History reviewed. No pertinent past medical history.  Patient Active Problem List   Diagnosis Date Noted   Eustachian tube dysfunction 01/28/2023   Rash and other nonspecific skin eruption 11/17/2022   Prednisone adverse reaction 11/16/2022   Swelling 11/16/2022   Leukocytosis 08/08/2022   Unspecified abdominal pain 08/08/2022   Major depressive disorder, recurrent episode, moderate degree (HCC) 11/27/2014   Postpartum care following vaginal delivery 11/16/2014   Anemia 10/30/2014   Pain, dental 10/12/2014   Uterine size date discrepancy pregnancy 10/04/2014   Abnormal cervical Papanicolaou smear affecting pregnancy in second trimester 06/14/2014   Recurrent urinary tract infection affecting pregnancy in second trimester 06/14/2014   Depression 05/06/2014   History of maternal fourth degree perineal laceration, currently pregnant in first trimester 05/06/2014   Marijuana use 05/06/2014   Vaginal candidiasis 05/06/2014   Supervision of high risk pregnancy in third trimester 05/03/2014   Menstrual pain 03/27/2010   Smoker 03/27/2010    Past Surgical History:  Procedure Laterality Date   TUBAL LIGATION      OB History   No obstetric history on file.      Home Medications     Prior to Admission medications   Medication Sig Start Date End Date Taking? Authorizing Provider  albuterol (VENTOLIN HFA) 108 (90 Base) MCG/ACT inhaler Inhale 2 puffs into the lungs every 6 (six) hours as needed for wheezing or shortness of breath. 05/16/21  Yes Cheron Schaumann K, PA-C  baclofen (LIORESAL) 10 MG tablet Take 1 tablet (10 mg total) by mouth 3 (three) times daily. 06/22/23  Yes Becky Augusta, NP  diazepam (VALIUM) 5 MG tablet Take 1 tablet (5 mg total) by mouth at bedtime for 5 days. 06/22/23 06/27/23 Yes Becky Augusta, NP  escitalopram (LEXAPRO) 10 MG tablet Take 10 mg by mouth daily.   Yes [provider]  ibuprofen (ADVIL) 600 MG tablet Take 1 tablet (600 mg total) by mouth every 6 (six) hours as needed. 06/22/23  Yes Becky Augusta, NP    Family History History reviewed. No pertinent family history.  Social History Social History   Tobacco Use   Smoking status: Every Day    Current packs/day: 0.50    Types: Cigarettes   Smokeless tobacco: Never  Vaping Use   Vaping status: Former  Substance Use Topics   Alcohol use: No   Drug use: No     Allergies   Patient has no known allergies.   Review of Systems Review of Systems  Musculoskeletal:  Positive for arthralgias, myalgias and neck pain. Negative for joint swelling.  Neurological:  Negative for numbness.     Physical Exam Triage Vital Signs ED Triage Vitals  Encounter Vitals Group     BP 06/22/23 1116 119/62     Systolic BP Percentile --      Diastolic BP Percentile --      Pulse Rate 06/22/23 1116 65     Resp 06/22/23 1116 16     Temp 06/22/23 1116 98.8 F (37.1 C)     Temp Source 06/22/23 1116 Oral     SpO2 06/22/23 1116 98 %     Weight 06/22/23 1115 115 lb (52.2 kg)     Height 06/22/23 1115 5\' 2"  (1.575 m)     Head Circumference --      Peak Flow --      Pain Score 06/22/23 1119 8     Pain Loc --      Pain Education --      Exclude from Growth Chart --    No data found.  Updated  Vital Signs BP 119/62 (BP Location: Right Arm)   Pulse 65   Temp 98.8 F (37.1 C) (Oral)   Resp 16   Ht 5\' 2"  (1.575 m)   Wt 115 lb (52.2 kg)   SpO2 98%   BMI 21.03 kg/m   Visual Acuity Right Eye Distance:   Left Eye Distance:   Bilateral Distance:    Right Eye Near:   Left Eye Near:    Bilateral Near:     Physical Exam Vitals and nursing note reviewed.  Constitutional:      Appearance: Normal appearance. She is not ill-appearing.  Musculoskeletal:        General: Tenderness present. No swelling or signs of injury.  Skin:    General: Skin is warm and dry.     Capillary Refill: Capillary refill takes less than 2 seconds.     Findings: No erythema.  Neurological:     General: No focal deficit present.     Mental Status: She is alert and oriented to person, place, and time.      UC Treatments / Results  Labs (all labs ordered are listed, but only abnormal results are displayed) Labs Reviewed - No data to display  EKG   Radiology No results found.  Procedures Procedures (including critical care time)  Medications Ordered in UC Medications  ketorolac (TORADOL) 30 MG/ML injection 30 mg (has no administration in time range)    Initial Impression / Assessment and Plan / UC Course  I have reviewed the triage vital signs and the nursing notes.  Pertinent labs & imaging results that were available during my care of the patient were reviewed by me and considered in my medical decision making (see chart for details).   Patient is a nontoxic-appearing 46 old female presenting for evaluation of pain in her left shoulder girdle as outlined HPI above.  In the exam room her axial carriages sitting in normal axial alignment.  She has no midline spinous process tenderness or step-off in her cervical or upper thoracic spine.  She does have significant tension and spasm in bilateral trapezius muscles.  The left is more tender than the right, though the right has greater spasm  upon palpation.  I will treat her for left shoulder girdle pain with 600 mg of ibuprofen every 6 hours with food along with 10 mg of baclofen every 8 hours.  I will have staff administer 30 mg of IM Toradol prior to discharge.  Additionally, we discussed using home physical therapy, moist heat, and gentle massage after 48 hours.  I will also  prescribe a low-dose Valium tablet for the patient to take at bedtime to help relax the muscles in her neck and aid her in sleep.  She has no open narcotics prescription and PDMP.   Final Clinical Impressions(s) / UC Diagnoses   Final diagnoses:  Pain of left shoulder girdle     Discharge Instructions      Take the ibuprofen, 600 mg every 6 hours with food, on a schedule for the next 48 hours and then as needed.  Take the baclofen, 10 mg every 8 hours, on a schedule for the next 48 hours and then as needed.  Apply moist heat to your neck for 30 minutes at a time 2-3 times a day to improve blood flow to the area and help remove the lactic acid causing the spasm.  Follow the neck exercises given at discharge.  Take the diazepam 5 mg at bedtime to help relax the muscles in your neck and aid in sleep.  Return for reevaluation for any new or worsening symptoms.      ED Prescriptions     Medication Sig Dispense Auth. Provider   diazepam (VALIUM) 5 MG tablet Take 1 tablet (5 mg total) by mouth at bedtime for 5 days. 5 tablet Becky Augusta, NP   ibuprofen (ADVIL) 600 MG tablet Take 1 tablet (600 mg total) by mouth every 6 (six) hours as needed. 30 tablet Becky Augusta, NP   baclofen (LIORESAL) 10 MG tablet Take 1 tablet (10 mg total) by mouth 3 (three) times daily. 30 each Becky Augusta, NP      I have reviewed the PDMP during this encounter.   Becky Augusta, NP 06/22/23 1144

## 2023-06-22 NOTE — ED Triage Notes (Signed)
 Pt c/o L arm pain x1 wk. States I've had bad pain in my left arm, into my back and in my neck for the last few days. I'm not able to tolerate it anymore, I'm not getting sleep. I can't roll over, turn my head, lift my arm or anything. - Entered by patient

## 2023-07-03 ENCOUNTER — Ambulatory Visit
Admission: EM | Admit: 2023-07-03 | Discharge: 2023-07-03 | Disposition: A | Discharge status: Home / Self Care | Attending: Physician Assistant | Admitting: Physician Assistant

## 2023-07-03 ENCOUNTER — Ambulatory Visit (INDEPENDENT_AMBULATORY_CARE_PROVIDER_SITE_OTHER)

## 2023-07-03 ENCOUNTER — Encounter: Payer: Self-pay | Admitting: Emergency Medicine

## 2023-07-03 DIAGNOSIS — M79601 Pain in right arm: Secondary | ICD-10-CM

## 2023-07-03 DIAGNOSIS — S40021A Contusion of right upper arm, initial encounter: Secondary | ICD-10-CM

## 2023-07-03 MED ORDER — IBUPROFEN 800 MG PO TABS: 800.0000 mg | ORAL_TABLET | Freq: Three times a day (TID) | ORAL | 0 refills | Status: AC | PRN

## 2023-07-03 NOTE — Discharge Instructions (Signed)
-  No fractures. - Ice and elevate the arm frequently. - May take ibuprofen and Tylenol as needed for pain relief. - May use an Ace wrap to help apply some compression for swelling and discomfort. - Avoid painful activities.

## 2023-07-03 NOTE — ED Provider Notes (Signed)
 [facility]    CSN: [id number] Arrival date & time: 07/03/23  1813      History   Chief Complaint Chief Complaint  Patient presents with   Arm Pain    right    HPI Andrea Villarreal is a 33 y.o. female presenting for right forearm pain.  Patient reports she got angry at her fianc and threw a box of M&Ms really hard onto the floor.  She says when she was throwing it down her forearm hit the post on a chair.  She has a contusion of the ventral forearm.  Reports pain in entire forearm.  States when she moves her hand or makes a fist she feels increased pain in her forearm.  Has pain with movement of the wrist.  Has not taken anything for pain relief or applied ice.  States this just occurred.  She is afraid she could have a fracture of her arm.  She is right-handed.  HPI  History reviewed. No pertinent past medical history.  Patient Active Problem List   Diagnosis Date Noted   Eustachian tube dysfunction 01/28/2023   Rash and other nonspecific skin eruption 11/17/2022   Prednisone adverse reaction 11/16/2022   Swelling 11/16/2022   Leukocytosis 08/08/2022   Unspecified abdominal pain 08/08/2022   Major depressive disorder, recurrent episode, moderate degree (HCC) 11/27/2014   Postpartum care following vaginal delivery 11/16/2014   Anemia 10/30/2014   Pain, dental 10/12/2014   Uterine size date discrepancy pregnancy 10/04/2014   Abnormal cervical Papanicolaou smear affecting pregnancy in second trimester 06/14/2014   Recurrent urinary tract infection affecting pregnancy in second trimester 06/14/2014   Depression 05/06/2014   History of maternal fourth degree perineal laceration, currently pregnant in first trimester 05/06/2014   Marijuana use 05/06/2014   Vaginal candidiasis 05/06/2014   Supervision of high risk pregnancy in third trimester 05/03/2014   Menstrual pain 03/27/2010   Smoker 03/27/2010    Past Surgical History:  Procedure Laterality Date    TUBAL LIGATION      OB History   No obstetric history on file.      Home Medications    Prior to Admission medications   Medication Sig Start Date End Date Taking? Authorizing Provider  ibuprofen (ADVIL) 800 MG tablet Take 1 tablet (800 mg total) by mouth every 8 (eight) hours as needed for moderate pain (pain score 4-6). 07/03/23  Yes Shirlee Latch, PA-C  albuterol (VENTOLIN HFA) 108 (90 Base) MCG/ACT inhaler Inhale 2 puffs into the lungs every 6 (six) hours as needed for wheezing or shortness of breath. 05/16/21   Elson Areas, PA-C  baclofen (LIORESAL) 10 MG tablet Take 1 tablet (10 mg total) by mouth 3 (three) times daily. 06/22/23   Becky Augusta, NP  escitalopram (LEXAPRO) 10 MG tablet Take 10 mg by mouth daily.    [provider]    Family History History reviewed. No pertinent family history.  Social History Social History   Tobacco Use   Smoking status: Every Day    Current packs/day: 0.50    Types: Cigarettes   Smokeless tobacco: Never  Vaping Use   Vaping status: Former  Substance Use Topics   Alcohol use: No   Drug use: No     Allergies   Patient has no known allergies.   Review of Systems Review of Systems  Musculoskeletal:  Positive for arthralgias and joint swelling.  Skin:  Positive for color change. Negative for wound.  Neurological:  Negative  for weakness and numbness.     Physical Exam Triage Vital Signs ED Triage Vitals  Encounter Vitals Group     BP 07/03/23 1828 (P) 130/80     Systolic BP Percentile --      Diastolic BP Percentile --      Pulse Rate 07/03/23 1828 (P) 74     Resp 07/03/23 1828 (P) 14     Temp 07/03/23 1828 (P) 99.7 F (37.6 C)     Temp Source 07/03/23 1828 (P) Oral     SpO2 07/03/23 1828 (P) 97 %     Weight 07/03/23 1826 115 lb 1.3 oz (52.2 kg)     Height 07/03/23 1826 5\' 2"  (1.575 m)     Head Circumference --      Peak Flow --      Pain Score 07/03/23 1826 8     Pain Loc --      Pain Education --       Exclude from Growth Chart --    No data found.  Updated Vital Signs BP (P) 130/80 (BP Location: Left Arm)   Pulse (P) 74   Temp (P) 99.7 F (37.6 C) (Oral)   Resp (P) 14   Ht 5\' 2"  (1.575 m)   Wt 115 lb 1.3 oz (52.2 kg)   LMP 06/26/2023 (Approximate)   SpO2 (P) 97%   BMI 21.05 kg/m      Physical Exam Vitals and nursing note reviewed.  Constitutional:      General: She is not in acute distress.    Appearance: Normal appearance. She is not ill-appearing or toxic-appearing.  HENT:     Head: Normocephalic and atraumatic.  Eyes:     General: No scleral icterus.       Right eye: No discharge.        Left eye: No discharge.     Conjunctiva/sclera: Conjunctivae normal.  Cardiovascular:     Rate and Rhythm: Normal rate.     Pulses: Normal pulses.  Pulmonary:     Effort: Pulmonary effort is normal. No respiratory distress.  Musculoskeletal:     Cervical back: Neck supple.     Comments: Right arm: There is a contusion of the ventral forearm.  Mild increased swelling in this area.  Tenderness of the ventral ulnar aspect of the forearm.  Reduced range of motion of wrist due to discomfort.  No tenderness of hand or elbow.  Good pulses.  Skin:    General: Skin is dry.  Neurological:     General: No focal deficit present.     Mental Status: She is alert. Mental status is at baseline.     Motor: No weakness.     Gait: Gait normal.  Psychiatric:        Mood and Affect: Mood normal.        Behavior: Behavior normal.      UC Treatments / Results  Labs (all labs ordered are listed, but only abnormal results are displayed) Labs Reviewed - No data to display  EKG   Radiology DG Forearm Right Result Date: 07/03/2023 CLINICAL DATA:  Right forearm pain EXAM: RIGHT FOREARM - 2 VIEW COMPARISON:  None Available. FINDINGS: There is no evidence of fracture or other focal bone lesions. Normal alignment. Joint spaces are preserved. Soft tissues are unremarkable. IMPRESSION: Negative.  Electronically Signed   By: Duanne Guess D.O.   On: 07/03/2023 18:52    Procedures Procedures (including critical care time)  Medications Ordered in  UC Medications - No data to display  Initial Impression / Assessment and Plan / UC Course  I have reviewed the triage vital signs and the nursing notes.  Pertinent labs & imaging results that were available during my care of the patient were reviewed by me and considered in my medical decision making (see chart for details).   33 year old female presents for right forearm/wrist pain after violently slamming down a box of M&Ms and injuring her arm on a chair.  This occurred tonight.  X-ray forearm obtained shows no fractures.  Patient requests a wrist brace.  States she cannot move her wrist without a lot of pain in her arm.  Wrist brace given to patient.  Discussed care of contusion.  Advised cryotherapy.  Sent ibuprofen to pharmacy.  Reviewed return precautions.   Final Clinical Impressions(s) / UC Diagnoses   Final diagnoses:  Right arm pain  Contusion of right upper extremity, initial encounter     Discharge Instructions      -No fractures. - Ice and elevate the arm frequently. - May take ibuprofen and Tylenol as needed for pain relief. - May use an Ace wrap to help apply some compression for swelling and discomfort. - Avoid painful activities.     ED Prescriptions     Medication Sig Dispense Auth. Provider   ibuprofen (ADVIL) 800 MG tablet Take 1 tablet (800 mg total) by mouth every 8 (eight) hours as needed for moderate pain (pain score 4-6). 30 tablet Gareth Morgan      PDMP not reviewed this encounter.   Shirlee Latch, PA-C 07/03/23 1905

## 2023-07-03 NOTE — ED Triage Notes (Signed)
 Patient states that about 30 min ago, she got really mad and through a box of M&Ms on the floor hard and when she did she hit her right forearm and wrist on the kitchen table.  Patient c/o pain in her right wrist, forearm and elbow.

## 2024-02-27 ENCOUNTER — Encounter: Payer: Self-pay | Admitting: Emergency Medicine

## 2024-02-27 ENCOUNTER — Ambulatory Visit: Admission: EM | Admit: 2024-02-27 | Discharge: 2024-02-27 | Disposition: A | Discharge status: Home / Self Care

## 2024-02-27 DIAGNOSIS — J019 Acute sinusitis, unspecified: Secondary | ICD-10-CM | POA: Diagnosis not present

## 2024-02-27 DIAGNOSIS — R0981 Nasal congestion: Secondary | ICD-10-CM | POA: Diagnosis not present

## 2024-02-27 MED ORDER — AMOXICILLIN-POT CLAVULANATE 875-125 MG PO TABS: 1.0000 | ORAL_TABLET | Freq: Two times a day (BID) | ORAL | 0 refills | Status: AC

## 2024-02-27 MED ORDER — IPRATROPIUM BROMIDE 0.06 % NA SOLN: 2.0000 | Freq: Four times a day (QID) | NASAL | 0 refills | Status: AC

## 2024-02-27 MED ORDER — PSEUDOEPH-BROMPHEN-DM 30-2-10 MG/5ML PO SYRP: 10.0000 mL | ORAL_SOLUTION | Freq: Four times a day (QID) | ORAL | 0 refills | Status: AC | PRN

## 2024-02-27 NOTE — ED Triage Notes (Signed)
 Pt c/o sinus infection. She states it started about a week ago. She states her snot was clear, then yellow and now green. She states she nasal congestion, runny nose and sinus pressure. Denies fever.

## 2024-02-27 NOTE — Discharge Instructions (Addendum)
-   You have a sinus infection but it is most likely viral.  Antibiotics may not help.  I sent antibiotics to pharmacy since you are feeling so badly to see if it will help.  Also sent cough medication and a nasal spray. - If you develop fever or worsening symptoms please return, otherwise this could take another week or 2 to fully resolve.

## 2024-02-27 NOTE — ED Provider Notes (Signed)
 [facility]    CSN: [id number] Arrival date & time: 02/27/24  1301      History   Chief Complaint Chief Complaint  Patient presents with   Facial Pain    HPI Andrea Villarreal is a 33 y.o. female presenting for >1 week history of cough, congestion and sinus pressure. Symptoms have worsened over the past 2 days. Reports a lot sinus pain now and yellow/green nasal drainage. No fever. Mild cough. No sore throat, ear pain, chest pain, shortness of breath. No improvement in symptoms with OTC.  HPI  History reviewed. No pertinent past medical history.  Patient Active Problem List   Diagnosis Date Noted   Eustachian tube dysfunction 01/28/2023   Rash and other nonspecific skin eruption 11/17/2022   Prednisone  adverse reaction 11/16/2022   Swelling 11/16/2022   Leukocytosis 08/08/2022   Unspecified abdominal pain 08/08/2022   Major depressive disorder, recurrent episode, moderate degree (HCC) 11/27/2014   Postpartum care following vaginal delivery 11/16/2014   Anemia 10/30/2014   Pain, dental 10/12/2014   Uterine size date discrepancy pregnancy 10/04/2014   Abnormal cervical Papanicolaou smear affecting pregnancy in second trimester 06/14/2014   Recurrent urinary tract infection affecting pregnancy in second trimester 06/14/2014   Depression 05/06/2014   History of maternal fourth degree perineal laceration, currently pregnant in first trimester 05/06/2014   Marijuana use 05/06/2014   Vaginal candidiasis 05/06/2014   Supervision of high risk pregnancy in third trimester 05/03/2014   Menstrual pain 03/27/2010   Smoker 03/27/2010    Past Surgical History:  Procedure Laterality Date   TUBAL LIGATION      OB History   No obstetric history on file.      Home Medications    Prior to Admission medications   Medication Sig Start Date End Date Taking? Authorizing Provider  amoxicillin -clavulanate (AUGMENTIN ) 875-125 MG tablet Take 1 tablet by mouth every  12 (twelve) hours for 7 days. 02/27/24 03/05/24 Yes Arvis Huxley B, PA-C  brompheniramine-pseudoephedrine-DM 30-2-10 MG/5ML syrup Take 10 mLs by mouth 4 (four) times daily as needed for up to 7 days. 02/27/24 03/05/24 Yes Arvis Huxley NOVAK, PA-C  EFFEXOR XR 150 MG 24 hr capsule EFFEXOR XR 150 MG XR24H-CAP 01/13/24  Yes [provider]  ipratropium (ATROVENT ) 0.06 % nasal spray Place 2 sprays into both nostrils 4 (four) times daily. 02/27/24  Yes Arvis Huxley NOVAK, PA-C  albuterol  (VENTOLIN  HFA) 108 (90 Base) MCG/ACT inhaler Inhale 2 puffs into the lungs every 6 (six) hours as needed for wheezing or shortness of breath. 05/16/21   Sofia, Leslie K, PA-C  baclofen  (LIORESAL ) 10 MG tablet Take 1 tablet (10 mg total) by mouth 3 (three) times daily. 06/22/23   Bernardino Ditch, NP  escitalopram (LEXAPRO) 10 MG tablet Take 10 mg by mouth daily.    [provider]  ibuprofen  (ADVIL ) 800 MG tablet Take 1 tablet (800 mg total) by mouth every 8 (eight) hours as needed for moderate pain (pain score 4-6). 07/03/23   Arvis Huxley NOVAK, PA-C  norethindrone (MICRONOR) 0.35 MG tablet Take by mouth.    [provider]    Family History History reviewed. No pertinent family history.  Social History Social History   Tobacco Use   Smoking status: Every Day    Current packs/day: 0.50    Types: Cigarettes   Smokeless tobacco: Never  Vaping Use   Vaping status: Former  Substance Use Topics   Alcohol use: No   Drug use: No  Allergies   Patient has no known allergies.   Review of Systems Review of Systems  Constitutional:  Positive for fatigue. Negative for chills, diaphoresis and fever.  HENT:  Positive for congestion, rhinorrhea, sinus pressure and sinus pain. Negative for ear pain and sore throat.   Respiratory:  Negative for cough and shortness of breath.   Cardiovascular:  Negative for chest pain.  Gastrointestinal:  Negative for abdominal pain, nausea and vomiting.   Musculoskeletal:  Negative for arthralgias and myalgias.  Skin:  Negative for rash.  Neurological:  Negative for weakness and headaches.  Hematological:  Negative for adenopathy.     Physical Exam Triage Vital Signs ED Triage Vitals  Encounter Vitals Group     BP 02/27/24 1339 117/75     Girls Systolic BP Percentile --      Girls Diastolic BP Percentile --      Boys Systolic BP Percentile --      Boys Diastolic BP Percentile --      Pulse Rate 02/27/24 1339 72     Resp 02/27/24 1339 16     Temp 02/27/24 1339 98.5 F (36.9 C)     Temp Source 02/27/24 1339 Oral     SpO2 02/27/24 1339 97 %     Weight 02/27/24 1338 115 lb 1.3 oz (52.2 kg)     Height 02/27/24 1338 5' 2 (1.575 m)     Head Circumference --      Peak Flow --      Pain Score 02/27/24 1337 5     Pain Loc --      Pain Education --      Exclude from Growth Chart --    No data found.  Updated Vital Signs BP 117/75 (BP Location: Right Arm)   Pulse 72   Temp 98.5 F (36.9 C) (Oral)   Resp 16   Ht 5' 2 (1.575 m)   Wt 115 lb 1.3 oz (52.2 kg)   LMP 02/26/2024 (Exact Date)   SpO2 97%   BMI 21.05 kg/m    Physical Exam Vitals and nursing note reviewed.  Constitutional:      General: She is not in acute distress.    Appearance: Normal appearance. She is not ill-appearing or toxic-appearing.  HENT:     Head: Normocephalic and atraumatic.     Right Ear: Tympanic membrane, ear canal and external ear normal.     Left Ear: Tympanic membrane, ear canal and external ear normal.     Nose: Congestion present.     Mouth/Throat:     Mouth: Mucous membranes are moist.     Pharynx: Oropharynx is clear.  Eyes:     General: No scleral icterus.       Right eye: No discharge.        Left eye: No discharge.     Conjunctiva/sclera: Conjunctivae normal.  Cardiovascular:     Rate and Rhythm: Normal rate and regular rhythm.     Heart sounds: Normal heart sounds.  Pulmonary:     Effort: Pulmonary effort is normal. No  respiratory distress.     Breath sounds: Normal breath sounds.  Musculoskeletal:     Cervical back: Neck supple.  Skin:    General: Skin is dry.  Neurological:     General: No focal deficit present.     Mental Status: She is alert. Mental status is at baseline.     Motor: No weakness.     Gait: Gait normal.  Psychiatric:  Mood and Affect: Mood normal.        Behavior: Behavior normal.      UC Treatments / Results  Labs (all labs ordered are listed, but only abnormal results are displayed) Labs Reviewed - No data to display  EKG   Radiology No results found.  Procedures Procedures (including critical care time)  Medications Ordered in UC Medications - No data to display  Initial Impression / Assessment and Plan / UC Course  I have reviewed the triage vital signs and the nursing notes.  Pertinent labs & imaging results that were available during my care of the patient were reviewed by me and considered in my medical decision making (see chart for details).   33 y/o female presents for nasal congestion, cough and sinus pain worsening for over a week. No fever. No improvement with OTC meds.  Acute sinusitis. Advised likely viral. Patient would like to try antibiotics. Sent Augmentin , Bromfed DM and a nasal spray.  Supportive care encouraged.  Reviewed return precautions.   Final Clinical Impressions(s) / UC Diagnoses   Final diagnoses:  Acute sinusitis, recurrence not specified, unspecified location  Nasal congestion     Discharge Instructions      - You have a sinus infection but it is most likely viral.  Antibiotics may not help.  I sent antibiotics to pharmacy since you are feeling so badly to see if it will help.  Also sent cough medication and a nasal spray. - If you develop fever or worsening symptoms please return, otherwise this could take another week or 2 to fully resolve.     ED Prescriptions     Medication Sig Dispense Auth. Provider    amoxicillin -clavulanate (AUGMENTIN ) 875-125 MG tablet Take 1 tablet by mouth every 12 (twelve) hours for 7 days. 14 tablet Arvis Huxley B, PA-C   ipratropium (ATROVENT ) 0.06 % nasal spray Place 2 sprays into both nostrils 4 (four) times daily. 15 mL Arvis Huxley B, PA-C   brompheniramine-pseudoephedrine-DM 30-2-10 MG/5ML syrup Take 10 mLs by mouth 4 (four) times daily as needed for up to 7 days. 150 mL Arvis Huxley NOVAK, PA-C      PDMP not reviewed this encounter.   Arvis Huxley NOVAK, PA-C 02/27/24 1413
